# Patient Record
Sex: Male | Born: 1945 | Race: White | Hispanic: No | Marital: Married | Smoking: Former smoker
Health system: Southern US, Community
[De-identification: ages and names within clinical notes are randomized; demographics above are authoritative.]

## PROBLEM LIST (undated history)

## (undated) DIAGNOSIS — I4891 Unspecified atrial fibrillation: Secondary | ICD-10-CM

## (undated) DIAGNOSIS — N400 Enlarged prostate without lower urinary tract symptoms: Secondary | ICD-10-CM

## (undated) DIAGNOSIS — I10 Essential (primary) hypertension: Secondary | ICD-10-CM

## (undated) DIAGNOSIS — J449 Chronic obstructive pulmonary disease, unspecified: Secondary | ICD-10-CM

## (undated) HISTORY — PX: HERNIA REPAIR: SHX51

---

## 2003-06-12 ENCOUNTER — Ambulatory Visit (HOSPITAL_COMMUNITY): Admission: RE | Admit: 2003-06-12 | Discharge: 2003-06-12 | Payer: Self-pay | Admitting: Internal Medicine

## 2003-06-12 ENCOUNTER — Encounter: Payer: Self-pay | Admitting: Internal Medicine

## 2003-06-12 ENCOUNTER — Encounter (INDEPENDENT_AMBULATORY_CARE_PROVIDER_SITE_OTHER): Payer: Self-pay | Admitting: Internal Medicine

## 2003-09-20 ENCOUNTER — Emergency Department (HOSPITAL_COMMUNITY): Admission: EM | Admit: 2003-09-20 | Discharge: 2003-09-20 | Payer: Self-pay | Admitting: Emergency Medicine

## 2004-05-28 ENCOUNTER — Encounter (INDEPENDENT_AMBULATORY_CARE_PROVIDER_SITE_OTHER): Payer: Self-pay | Admitting: Internal Medicine

## 2005-03-11 ENCOUNTER — Encounter (INDEPENDENT_AMBULATORY_CARE_PROVIDER_SITE_OTHER): Payer: Self-pay | Admitting: Internal Medicine

## 2005-10-20 ENCOUNTER — Ambulatory Visit: Payer: Self-pay | Admitting: Internal Medicine

## 2005-10-31 ENCOUNTER — Telehealth (INDEPENDENT_AMBULATORY_CARE_PROVIDER_SITE_OTHER): Payer: Self-pay | Admitting: Internal Medicine

## 2007-09-12 ENCOUNTER — Ambulatory Visit: Payer: Self-pay | Admitting: Internal Medicine

## 2007-09-12 DIAGNOSIS — J441 Chronic obstructive pulmonary disease with (acute) exacerbation: Secondary | ICD-10-CM | POA: Insufficient documentation

## 2007-09-12 DIAGNOSIS — J449 Chronic obstructive pulmonary disease, unspecified: Secondary | ICD-10-CM | POA: Insufficient documentation

## 2007-09-26 ENCOUNTER — Ambulatory Visit: Payer: Self-pay | Admitting: Internal Medicine

## 2007-09-27 ENCOUNTER — Telehealth (INDEPENDENT_AMBULATORY_CARE_PROVIDER_SITE_OTHER): Payer: Self-pay | Admitting: *Deleted

## 2007-09-27 LAB — CONVERTED CEMR LAB
BUN: 18 mg/dL (ref 6–23)
CO2: 26 meq/L (ref 19–32)
Calcium: 9.4 mg/dL (ref 8.4–10.5)
Chloride: 105 meq/L (ref 96–112)
Cholesterol: 173 mg/dL (ref 0–200)
Creatinine, Ser: 0.99 mg/dL (ref 0.40–1.50)
Glucose, Bld: 79 mg/dL (ref 70–99)
HDL: 34 mg/dL — ABNORMAL LOW (ref >39)
LDL Cholesterol: 115 mg/dL — ABNORMAL HIGH (ref 0–99)
Potassium: 4.7 meq/L (ref 3.5–5.3)
Sodium: 142 meq/L (ref 135–145)
Total CHOL/HDL Ratio: 5.1
Triglycerides: 118 mg/dL (ref <150)
VLDL: 24 mg/dL (ref 0–40)

## 2007-09-28 ENCOUNTER — Encounter (INDEPENDENT_AMBULATORY_CARE_PROVIDER_SITE_OTHER): Payer: Self-pay | Admitting: Internal Medicine

## 2007-11-26 ENCOUNTER — Ambulatory Visit: Payer: Self-pay | Admitting: Internal Medicine

## 2008-02-18 ENCOUNTER — Ambulatory Visit: Payer: Self-pay | Admitting: Internal Medicine

## 2008-02-18 DIAGNOSIS — R079 Chest pain, unspecified: Secondary | ICD-10-CM | POA: Insufficient documentation

## 2008-05-05 ENCOUNTER — Telehealth (INDEPENDENT_AMBULATORY_CARE_PROVIDER_SITE_OTHER): Payer: Self-pay | Admitting: Internal Medicine

## 2008-05-05 ENCOUNTER — Telehealth (INDEPENDENT_AMBULATORY_CARE_PROVIDER_SITE_OTHER): Payer: Self-pay | Admitting: *Deleted

## 2008-05-07 ENCOUNTER — Ambulatory Visit: Payer: Self-pay | Admitting: Internal Medicine

## 2008-05-28 ENCOUNTER — Ambulatory Visit: Payer: Self-pay | Admitting: Internal Medicine

## 2008-05-28 DIAGNOSIS — R030 Elevated blood-pressure reading, without diagnosis of hypertension: Secondary | ICD-10-CM

## 2008-10-15 ENCOUNTER — Ambulatory Visit: Payer: Self-pay | Admitting: Internal Medicine

## 2008-10-15 DIAGNOSIS — K5900 Constipation, unspecified: Secondary | ICD-10-CM | POA: Insufficient documentation

## 2010-10-12 NOTE — Letter (Signed)
Summary: Dr. Pearla Dubonnet Office Notes  Dr. Pearla Dubonnet Office Notes   Imported By: Lutricia Horsfall 09/12/2007 14:41:41  _____________________________________________________________________  External Attachment:    Type:   Image     Comment:   External Document

## 2011-10-05 ENCOUNTER — Emergency Department (HOSPITAL_COMMUNITY): Payer: Medicare Other

## 2011-10-05 ENCOUNTER — Emergency Department (HOSPITAL_COMMUNITY)
Admission: EM | Admit: 2011-10-05 | Discharge: 2011-10-05 | Disposition: A | Discharge status: Home / Self Care | Payer: Medicare Other | Attending: Emergency Medicine | Admitting: Emergency Medicine

## 2011-10-05 ENCOUNTER — Other Ambulatory Visit: Payer: Self-pay

## 2011-10-05 ENCOUNTER — Encounter (HOSPITAL_COMMUNITY): Payer: Self-pay | Admitting: Emergency Medicine

## 2011-10-05 DIAGNOSIS — Z87891 Personal history of nicotine dependence: Secondary | ICD-10-CM | POA: Insufficient documentation

## 2011-10-05 DIAGNOSIS — J438 Other emphysema: Secondary | ICD-10-CM | POA: Insufficient documentation

## 2011-10-05 DIAGNOSIS — J441 Chronic obstructive pulmonary disease with (acute) exacerbation: Secondary | ICD-10-CM

## 2011-10-05 DIAGNOSIS — I498 Other specified cardiac arrhythmias: Secondary | ICD-10-CM | POA: Insufficient documentation

## 2011-10-05 HISTORY — DX: Chronic obstructive pulmonary disease, unspecified: J44.9

## 2011-10-05 LAB — CBC
HCT: 42.6 % (ref 39.0–52.0)
Hemoglobin: 14.3 g/dL (ref 13.0–17.0)
MCH: 27.9 pg (ref 26.0–34.0)
MCHC: 33.6 g/dL (ref 30.0–36.0)
MCV: 83 fL (ref 78.0–100.0)
Platelets: 210 10*3/uL (ref 150–400)
RBC: 5.13 MIL/uL (ref 4.22–5.81)
RDW: 13.1 % (ref 11.5–15.5)
WBC: 8.4 10*3/uL (ref 4.0–10.5)

## 2011-10-05 LAB — DIFFERENTIAL
Basophils Absolute: 0 10*3/uL (ref 0.0–0.1)
Basophils Relative: 0 % (ref 0–1)
Eosinophils Absolute: 0 10*3/uL (ref 0.0–0.7)
Eosinophils Relative: 0 % (ref 0–5)
Lymphocytes Relative: 17 % (ref 12–46)
Lymphs Abs: 1.4 10*3/uL (ref 0.7–4.0)
Monocytes Absolute: 0.9 10*3/uL (ref 0.1–1.0)
Monocytes Relative: 10 % (ref 3–12)
Neutro Abs: 6.1 10*3/uL (ref 1.7–7.7)
Neutrophils Relative %: 72 % (ref 43–77)

## 2011-10-05 LAB — COMPREHENSIVE METABOLIC PANEL
ALT: 16 U/L (ref 0–53)
AST: 17 U/L (ref 0–37)
Albumin: 4 g/dL (ref 3.5–5.2)
Alkaline Phosphatase: 100 U/L (ref 39–117)
BUN: 18 mg/dL (ref 6–23)
CO2: 25 mEq/L (ref 19–32)
Calcium: 9.3 mg/dL (ref 8.4–10.5)
Chloride: 100 mEq/L (ref 96–112)
GFR calc Af Amer: >90 mL/min (ref >90)
Glucose, Bld: 105 mg/dL — ABNORMAL HIGH (ref 70–99)
Potassium: 3.9 mEq/L (ref 3.5–5.1)
Sodium: 136 mEq/L (ref 135–145)
Total Protein: 7.9 g/dL (ref 6.0–8.3)

## 2011-10-05 LAB — CARDIAC PANEL(CRET KIN+CKTOT+MB+TROPI)
Relative Index: 2.8 — ABNORMAL HIGH (ref 0.0–2.5)
Total CK: 114 U/L (ref 7–232)
Troponin I: <0.3 ng/mL (ref <0.30)

## 2011-10-05 MED ORDER — METHYLPREDNISOLONE SODIUM SUCC 125 MG IJ SOLR
125.0000 mg | Freq: Once | INTRAMUSCULAR | Status: AC
  Administered 2011-10-05: 125 mg via INTRAVENOUS
  Filled 2011-10-05: qty 2

## 2011-10-05 MED ORDER — AZITHROMYCIN 250 MG PO TABS
500.0000 mg | ORAL_TABLET | Freq: Once | ORAL | Status: AC
  Administered 2011-10-05: 500 mg via ORAL
  Filled 2011-10-05: qty 2

## 2011-10-05 MED ORDER — AZITHROMYCIN 250 MG PO TABS: 250.0000 mg | ORAL_TABLET | Freq: Every day | ORAL | Status: AC

## 2011-10-05 MED ORDER — PREDNISONE 10 MG PO TABS: 20.0000 mg | ORAL_TABLET | Freq: Two times a day (BID) | ORAL | Status: DC

## 2011-10-05 NOTE — ED Notes (Signed)
Pt coughed up thick white sputum while coughing.

## 2011-10-05 NOTE — ED Provider Notes (Signed)
History   This chart was scribed for Geoffery Lyons, MD by Clarita Crane. The patient was seen in room APA02/APA02 and the patient's care was started at 8:24PM.   CSN: [id number]  Arrival date & time 10/05/11  1944   First MD Initiated Contact with Patient 10/05/11 2022      Chief Complaint  Patient presents with  . Shortness of Breath    (Consider location/radiation/quality/duration/timing/severity/associated sxs/prior treatment) HPI Cole [name] is a 66 y.o. male who presents to the Emergency Department complaining of constant moderate to severe SOB onset 5 days ago and persistent since with associated chest congestion and productive cough. Notes use of home nebulizer provided mild to moderate relief. Denies chest pain, fever, swelling of lower extremities, abdominal pain. Patient with h/o emphysema. Denies cardiac history. Patient is a former smoker.  PCP-[place]  Past Medical History  Diagnosis Date  . COPD (chronic obstructive pulmonary disease)     Past Surgical History  Procedure Date  . Hernia repair     History reviewed. No pertinent family history.  History  Substance Use Topics  . Smoking status: Former Smoker    Quit date: 09/18/2002  . Smokeless tobacco: Not on file  . Alcohol Use: No      Review of Systems 10 Systems reviewed and are negative for acute change except as noted in the HPI.  Allergies  Review of patient's allergies indicates no known allergies.  Home Medications   Current Outpatient Rx  Name Route Sig Dispense Refill  . IPRATROPIUM-ALBUTEROL 18-103 MCG/ACT IN AERO Inhalation Inhale 2 puffs into the lungs 4 (four) times daily as needed. For shortness of breath    . IPRATROPIUM-ALBUTEROL 0.5-2.5 (3) MG/3ML IN SOLN Nebulization Take 3 mLs by nebulization every 4 (four) hours as needed. For shortness of breath      BP 157/97  Pulse 102  Temp(Src) 99.5 F (37.5 C) (Oral)  Resp 22  Ht 5\' 11"  (1.803 m)  Wt 194 lb (87.998 kg)  BMI 27.06  kg/m2  SpO2 90%  Physical Exam  Nursing note and vitals reviewed. Constitutional: He is oriented to person, place, and time. He appears well-developed and well-nourished. No distress.  HENT:  Head: Normocephalic and atraumatic.  Mouth/Throat: Oropharynx is clear and moist.  Eyes: EOM are normal. Pupils are equal, round, and reactive to light.  Neck: Neck supple. No tracheal deviation present.  Cardiovascular: Normal rate and regular rhythm.  Exam reveals no gallop and no friction rub.   No murmur heard. Pulmonary/Chest: Effort normal. He has no wheezes. He has no rales.       Expiratory Rhonchi bilaterally.   Abdominal: Soft. He exhibits no distension. There is no tenderness.  Musculoskeletal: Normal range of motion. He exhibits no edema.  Neurological: He is alert and oriented to person, place, and time. No sensory deficit.  Skin: Skin is warm and dry.  Psychiatric: He has a normal mood and affect. His behavior is normal.    ED Course  Procedures (including critical care time)  DIAGNOSTIC STUDIES: Oxygen Saturation is 90% on room air, low by my interpretation.    COORDINATION OF CARE: 8:26PM- Patient informed of current clinical impression and plan for treatment in ED. Patient agrees with plan set forth at this time.  9:59PM- Patient reports his SOB has improved at this time following administration of breathing treatments. Upon re-evaluation patient's breath sounds have improved. Patient informed of lab and imaging results. Patient informed of intent to d/c home. Patient agrees with  plan set forth at this time.  10:01PM- Patient ambulated with oxygen saturation monitor. Oxygen saturation levels remained constant during exertion. Will d/c patient home. Patient agrees with plan set forth.    Labs Reviewed  COMPREHENSIVE METABOLIC PANEL - Abnormal; Notable for the following:    Glucose, Bld 105 (*)    GFR calc non Af Amer 84 (*)    All other components within normal limits    CARDIAC PANEL(CRET KIN+CKTOT+MB+TROPI) - Abnormal; Notable for the following:    Relative Index 2.8 (*)    All other components within normal limits  CBC  DIFFERENTIAL   Dg Chest Portable 1 View  10/05/2011  *RADIOLOGY REPORT*  Clinical Data: Cough.  Shortness of breath.  PORTABLE CHEST - 1 VIEW  Comparison: Report from 09/20/2003  Findings: Airway thickening may reflect bronchitis or reactive airways disease.  No airspace opacity is identified to suggest bacterial pneumonia pattern.  The patient is rotated to the right on today's exam, resulting in reduced diagnostic sensitivity and specificity.  Cardiac and mediastinal contours appear unremarkable.  No pleural effusion is observed.  IMPRESSION:  1. Airway thickening may reflect bronchitis or reactive airways disease.  No airspace opacity is identified to suggest bacterial pneumonia pattern.  Original Report Authenticated By: Dellia Cloud, M.D.     No diagnosis found.   Date: 10/05/2011  Rate: 101  Rhythm: sinus tachycardia  QRS Axis: normal  Intervals: normal  ST/T Wave abnormalities: normal  Conduction Disutrbances:none  Narrative Interpretation:   Old EKG Reviewed: none available    MDM  Feels better with nebs, steroids.  He was walked around the ed with sats staying in the low 90's.  He looks good and will discharge him with antibiotics, steroids, regular nebs at home.      I personally performed the services described in this documentation, which was scribed in my presence. The recorded information has been reviewed and considered.     Geoffery Lyons, MD 10/05/11 [phone]

## 2011-10-05 NOTE — ED Notes (Signed)
Pt removed from O2 and ambulated in hallway by ED MD.

## 2011-10-05 NOTE — ED Notes (Signed)
Pt reports cough and shortness of breath that started on Saturday. Pt reports having a non-productive cough and increase shortness of breath that "got worse Today."

## 2011-10-05 NOTE — ED Notes (Signed)
Pt returned to O2 2L/[place] after obtaining RA sat.

## 2012-03-11 ENCOUNTER — Encounter (HOSPITAL_COMMUNITY): Payer: Self-pay | Admitting: *Deleted

## 2012-03-11 ENCOUNTER — Emergency Department (HOSPITAL_COMMUNITY)
Admission: EM | Admit: 2012-03-11 | Discharge: 2012-03-11 | Disposition: A | Discharge status: Home / Self Care | Payer: Medicare Other | Attending: Emergency Medicine | Admitting: Emergency Medicine

## 2012-03-11 DIAGNOSIS — J449 Chronic obstructive pulmonary disease, unspecified: Secondary | ICD-10-CM | POA: Insufficient documentation

## 2012-03-11 DIAGNOSIS — Z87891 Personal history of nicotine dependence: Secondary | ICD-10-CM | POA: Insufficient documentation

## 2012-03-11 DIAGNOSIS — L0390 Cellulitis, unspecified: Secondary | ICD-10-CM

## 2012-03-11 DIAGNOSIS — J4489 Other specified chronic obstructive pulmonary disease: Secondary | ICD-10-CM | POA: Insufficient documentation

## 2012-03-11 DIAGNOSIS — IMO0002 Reserved for concepts with insufficient information to code with codable children: Secondary | ICD-10-CM | POA: Insufficient documentation

## 2012-03-11 MED ORDER — TETANUS-DIPHTH-ACELL PERTUSSIS 5-2.5-18.5 LF-MCG/0.5 IM SUSP
0.5000 mL | Freq: Once | INTRAMUSCULAR | Status: AC
  Administered 2012-03-11: 0.5 mL via INTRAMUSCULAR
  Filled 2012-03-11: qty 0.5

## 2012-03-11 MED ORDER — AMOXICILLIN-POT CLAVULANATE 875-125 MG PO TABS: 1.0000 | ORAL_TABLET | Freq: Two times a day (BID) | ORAL | Status: AC

## 2012-03-11 MED ORDER — SODIUM CHLORIDE 0.9 % IV SOLN
3.0000 g | Freq: Once | INTRAVENOUS | Status: AC
  Administered 2012-03-11: 3 g via INTRAVENOUS
  Filled 2012-03-11: qty 3

## 2012-03-11 NOTE — Discharge Instructions (Signed)
Keep area/skin very clean. Take antibiotic (augmentin) as prescribed.  Take tylenol/advil as need.  Follow up with primary care doctor in the next 1-2 days for recheck. Return to ER right away if worse, spreading redness, increased swelling, severe pain, high fevers, weak/faint, other concern.      Cellulitis Cellulitis is an infection of the skin and the tissue beneath it. The area is typically red and tender. It is caused by germs (bacteria) (usually staph or strep) that enter the body through cuts or sores. Cellulitis most commonly occurs in the arms or lower legs.  HOME CARE INSTRUCTIONS   If you are given a prescription for medications which kill germs (antibiotics), take as directed until finished.   If the infection is on the arm or leg, keep the limb elevated as able.   Use a warm cloth several times per day to relieve pain and encourage healing.   See your caregiver for recheck of the infected site as directed if problems arise.   Only take over-the-counter or prescription medicines for pain, discomfort, or fever as directed by your caregiver.  SEEK MEDICAL CARE IF:   The area of redness (inflammation) is spreading, there are red streaks coming from the infected site, or if a part of the infection begins to turn dark in color.   The joint or bone underneath the infected skin becomes painful after the skin has healed.   The infection returns in the same or another area after it seems to have gone away.   A boil or bump swells up. This may be an abscess.   New, unexplained problems such as pain or fever develop.  SEEK IMMEDIATE MEDICAL CARE IF:   You have a fever.   You or your child feels drowsy or lethargic.   There is vomiting, diarrhea, or lasting discomfort or feeling ill (malaise) with muscle aches and pains.  MAKE SURE YOU:   Understand these instructions.   Will watch your condition.   Will get help right away if you are not doing well or get worse.    Document Released: 06/08/2005 Document Revised: 08/18/2011 Document Reviewed: 04/16/2008 [facility] Patient Information 2012 [place], [place].

## 2012-03-11 NOTE — ED Provider Notes (Signed)
History     CSN: [id number]  Arrival date & time 03/11/12  2044   First MD Initiated Contact with Patient 03/11/12 2105      Chief Complaint  Patient presents with  . Insect Bite  . Cellulitis    (Consider location/radiation/quality/duration/timing/severity/associated sxs/prior treatment) The history is provided by the patient.  pt c/o area soreness/redness to right forearm onset this morning. Says 2 weeks ago his dog may have bit his right arm, says was playing w dog and unsure if tooth or nails scratched him. Says dog acting normally, healthy, had its shots. pts tetanus unknown. No problems w area up until waking up this morning. Pt says unsure if insect bite or sting. Denies rapid progression of area of redness since onset. Is mildly sore. No severe arm pain. No erythematous streaking. No hx same. No hx cellulitis, no hx mrsa. Does not feel ill. No nv. No fever or chills. No weakness or faintness.   Past Medical History  Diagnosis Date  . COPD (chronic obstructive pulmonary disease)     Past Surgical History  Procedure Date  . Hernia repair     History reviewed. No pertinent family history.  History  Substance Use Topics  . Smoking status: Former Smoker    Quit date: 09/18/2002  . Smokeless tobacco: Not on file  . Alcohol Use: No      Review of Systems  Constitutional: Negative for fever and chills.  Respiratory: Negative for shortness of breath.   Cardiovascular: Negative for chest pain and leg swelling.  Gastrointestinal: Negative for nausea and vomiting.  Skin: Negative for rash.  Neurological: Negative for numbness.    Allergies  Review of patient's allergies indicates no known allergies.  Home Medications   Current Outpatient Rx  Name Route Sig Dispense Refill  . IPRATROPIUM-ALBUTEROL 18-103 MCG/ACT IN AERO Inhalation Inhale 2 puffs into the lungs 4 (four) times daily as needed. For shortness of breath    . IPRATROPIUM-ALBUTEROL 0.5-2.5 (3) MG/3ML IN  SOLN Nebulization Take 3 mLs by nebulization every 4 (four) hours as needed. For shortness of breath    . PREDNISONE 10 MG PO TABS Oral Take 2 tablets (20 mg total) by mouth 2 (two) times daily. 20 tablet 0    BP 167/80  Pulse 91  Temp 98.9 F (37.2 C)  Resp 20  Ht 5\' 9"  (1.753 m)  Wt 190 lb (86.183 kg)  BMI 28.06 kg/m2  SpO2 98%  Physical Exam  Nursing note and vitals reviewed. Constitutional: He is oriented to person, place, and time. He appears well-developed and well-nourished. No distress.  HENT:  Head: Atraumatic.  Eyes: Conjunctivae are normal.  Neck: Neck supple. No tracheal deviation present.  Cardiovascular: Normal rate.   Pulmonary/Chest: Effort normal. No accessory muscle usage. No respiratory distress.  Abdominal: He exhibits no distension.  Musculoskeletal: Normal range of motion.       Area of cellulitis to right forearm, mid forearm. Few healing scabs amidst area of cellulitis. No discrete bite mark seen. No fb. No area of fluctuance/abscess. No erythematous streaking. No l/a to RUE or axilla. Radial pulse 2+.   Neurological: He is alert and oriented to person, place, and time.  Skin: Skin is warm and dry.  Psychiatric: He has a normal mood and affect.    ED Course  Procedures (including critical care time)    MDM  Iv ns.  Exam c/w cellulitis.  Given hx dog bite to area (2 weeks ago), small scabs to  area from that prior bite, will rx unasyn then augmentin.  Tetanus im.    Outlined area of erythema/cellulitis. Discussed w pt pcp f/u 1-2 days, w return to ER right away if worse, spreading redness, severe pain, high fevers, nv, weak/faint.        Suzi Roots, MD 03/11/12 2127

## 2012-03-11 NOTE — ED Notes (Signed)
Pt states he thinks he was bit by something in his sleep last night. Pts right forearm is read and swollen.

## 2013-11-20 ENCOUNTER — Ambulatory Visit (HOSPITAL_COMMUNITY)
Admission: RE | Admit: 2013-11-20 | Discharge: 2013-11-20 | Disposition: A | Discharge status: Home / Self Care | Payer: Medicare PPO | Source: Ambulatory Visit | Attending: Family Medicine | Admitting: Family Medicine

## 2013-11-20 ENCOUNTER — Other Ambulatory Visit (HOSPITAL_COMMUNITY): Payer: Self-pay | Admitting: Family Medicine

## 2013-11-20 DIAGNOSIS — M1990 Unspecified osteoarthritis, unspecified site: Secondary | ICD-10-CM

## 2013-11-20 DIAGNOSIS — IMO0002 Reserved for concepts with insufficient information to code with codable children: Secondary | ICD-10-CM

## 2013-11-20 DIAGNOSIS — M51379 Other intervertebral disc degeneration, lumbosacral region without mention of lumbar back pain or lower extremity pain: Secondary | ICD-10-CM | POA: Insufficient documentation

## 2013-11-20 DIAGNOSIS — M47817 Spondylosis without myelopathy or radiculopathy, lumbosacral region: Secondary | ICD-10-CM | POA: Insufficient documentation

## 2013-11-20 DIAGNOSIS — M5137 Other intervertebral disc degeneration, lumbosacral region: Secondary | ICD-10-CM | POA: Insufficient documentation

## 2016-12-28 ENCOUNTER — Observation Stay (HOSPITAL_COMMUNITY)
Admission: EM | Admit: 2016-12-28 | Discharge: 2016-12-29 | Disposition: A | Discharge status: Home / Self Care | Payer: Medicare Other | Attending: Family Medicine | Admitting: Family Medicine

## 2016-12-28 ENCOUNTER — Emergency Department (HOSPITAL_COMMUNITY): Payer: Medicare Other

## 2016-12-28 ENCOUNTER — Encounter (HOSPITAL_COMMUNITY): Payer: Self-pay | Admitting: Physician Assistant

## 2016-12-28 DIAGNOSIS — R55 Syncope and collapse: Secondary | ICD-10-CM | POA: Insufficient documentation

## 2016-12-28 DIAGNOSIS — Z79899 Other long term (current) drug therapy: Secondary | ICD-10-CM | POA: Insufficient documentation

## 2016-12-28 DIAGNOSIS — Z8249 Family history of ischemic heart disease and other diseases of the circulatory system: Secondary | ICD-10-CM | POA: Diagnosis not present

## 2016-12-28 DIAGNOSIS — Z9119 Patient's noncompliance with other medical treatment and regimen: Secondary | ICD-10-CM | POA: Diagnosis not present

## 2016-12-28 DIAGNOSIS — J449 Chronic obstructive pulmonary disease, unspecified: Secondary | ICD-10-CM | POA: Diagnosis present

## 2016-12-28 DIAGNOSIS — J441 Chronic obstructive pulmonary disease with (acute) exacerbation: Secondary | ICD-10-CM | POA: Diagnosis not present

## 2016-12-28 DIAGNOSIS — I208 Other forms of angina pectoris: Secondary | ICD-10-CM

## 2016-12-28 DIAGNOSIS — R030 Elevated blood-pressure reading, without diagnosis of hypertension: Secondary | ICD-10-CM

## 2016-12-28 DIAGNOSIS — J410 Simple chronic bronchitis: Secondary | ICD-10-CM

## 2016-12-28 DIAGNOSIS — Z87891 Personal history of nicotine dependence: Secondary | ICD-10-CM | POA: Diagnosis not present

## 2016-12-28 DIAGNOSIS — R000 Tachycardia, unspecified: Secondary | ICD-10-CM | POA: Diagnosis not present

## 2016-12-28 DIAGNOSIS — I10 Essential (primary) hypertension: Secondary | ICD-10-CM | POA: Diagnosis not present

## 2016-12-28 DIAGNOSIS — I4891 Unspecified atrial fibrillation: Principal | ICD-10-CM | POA: Diagnosis present

## 2016-12-28 LAB — CBC WITH DIFFERENTIAL/PLATELET
BASOS ABS: 0.1 10*3/uL (ref 0.0–0.1)
Basophils Relative: 1 %
EOS ABS: 0.2 10*3/uL (ref 0.0–0.7)
Eosinophils Relative: 2 %
HCT: 45.4 % (ref 39.0–52.0)
Hemoglobin: 15.5 g/dL (ref 13.0–17.0)
Lymphocytes Relative: 17 %
Lymphs Abs: 1.9 10*3/uL (ref 0.7–4.0)
MCH: 28.5 pg (ref 26.0–34.0)
MCHC: 34.1 g/dL (ref 30.0–36.0)
MCV: 83.6 fL (ref 78.0–100.0)
Monocytes Absolute: 0.7 10*3/uL (ref 0.1–1.0)
Monocytes Relative: 7 %
Neutro Abs: 8.1 10*3/uL — ABNORMAL HIGH (ref 1.7–7.7)
Neutrophils Relative %: 73 %
Platelets: 227 10*3/uL (ref 150–400)
RBC: 5.43 MIL/uL (ref 4.22–5.81)
RDW: 13 % (ref 11.5–15.5)
WBC: 11 10*3/uL — ABNORMAL HIGH (ref 4.0–10.5)

## 2016-12-28 LAB — TROPONIN I
Troponin I: <0.03 ng/mL (ref <0.03)
Troponin I: <0.03 ng/mL (ref <0.03)
Troponin I: <0.03 ng/mL (ref <0.03)

## 2016-12-28 LAB — BASIC METABOLIC PANEL
Anion gap: 5 (ref 5–15)
BUN: 20 mg/dL (ref 6–20)
CO2: 29 mmol/L (ref 22–32)
Calcium: 8.9 mg/dL (ref 8.9–10.3)
Chloride: 102 mmol/L (ref 101–111)
Creatinine, Ser: 1.22 mg/dL (ref 0.61–1.24)
GFR calc Af Amer: >60 mL/min (ref >60)
GFR calc non Af Amer: 57 mL/min — ABNORMAL LOW (ref >60)
GLUCOSE: 121 mg/dL — AB (ref 65–99)
Potassium: 4.2 mmol/L (ref 3.5–5.1)
Sodium: 136 mmol/L (ref 135–145)

## 2016-12-28 LAB — MAGNESIUM: Magnesium: 2 mg/dL (ref 1.7–2.4)

## 2016-12-28 LAB — MRSA PCR SCREENING: MRSA by PCR: NEGATIVE

## 2016-12-28 LAB — BRAIN NATRIURETIC PEPTIDE: B Natriuretic Peptide: 56 pg/mL (ref 0.0–100.0)

## 2016-12-28 LAB — TSH: TSH: 1.413 u[IU]/mL (ref 0.350–4.500)

## 2016-12-28 MED ORDER — IPRATROPIUM BROMIDE 0.02 % IN SOLN: 0.5000 mg | Freq: Four times a day (QID) | RESPIRATORY_TRACT | Status: DC

## 2016-12-28 MED ORDER — DILTIAZEM HCL 60 MG PO TABS
60.0000 mg | ORAL_TABLET | Freq: Three times a day (TID) | ORAL | Status: DC
  Administered 2016-12-28 – 2016-12-29 (×3): 60 mg via ORAL
  Filled 2016-12-28 (×3): qty 1

## 2016-12-28 MED ORDER — DILTIAZEM LOAD VIA INFUSION
10.0000 mg | Freq: Once | INTRAVENOUS | Status: AC
  Administered 2016-12-28: 10 mg via INTRAVENOUS
  Filled 2016-12-28: qty 10

## 2016-12-28 MED ORDER — ACETAMINOPHEN 325 MG PO TABS: 650.0000 mg | ORAL_TABLET | ORAL | Status: DC | PRN

## 2016-12-28 MED ORDER — RIVAROXABAN 20 MG PO TABS
20.0000 mg | ORAL_TABLET | Freq: Every day | ORAL | Status: DC
  Administered 2016-12-28 – 2016-12-29 (×2): 20 mg via ORAL
  Filled 2016-12-28 (×2): qty 1

## 2016-12-28 MED ORDER — RIVAROXABAN (XARELTO) EDUCATION KIT FOR AFIB PATIENTS
PACK | Freq: Once | Status: AC
Start: 2016-12-28 — End: 2016-12-28
  Administered 2016-12-28: 10:00:00
  Filled 2016-12-28: qty 1

## 2016-12-28 MED ORDER — ONDANSETRON HCL 4 MG/2ML IJ SOLN: 4.0000 mg | Freq: Four times a day (QID) | INTRAMUSCULAR | Status: DC | PRN

## 2016-12-28 MED ORDER — ALBUTEROL SULFATE (2.5 MG/3ML) 0.083% IN NEBU: 2.5000 mg | INHALATION_SOLUTION | RESPIRATORY_TRACT | Status: DC | PRN

## 2016-12-28 MED ORDER — DILTIAZEM HCL-DEXTROSE 100-5 MG/100ML-% IV SOLN (PREMIX)
5.0000 mg/h | INTRAVENOUS | Status: DC
  Administered 2016-12-28: 10 mg/h via INTRAVENOUS
  Filled 2016-12-28 (×2): qty 100

## 2016-12-28 MED ORDER — IPRATROPIUM-ALBUTEROL 0.5-2.5 (3) MG/3ML IN SOLN
3.0000 mL | Freq: Three times a day (TID) | RESPIRATORY_TRACT | Status: DC
  Administered 2016-12-28 – 2016-12-29 (×3): 3 mL via RESPIRATORY_TRACT
  Filled 2016-12-28 (×3): qty 3

## 2016-12-28 MED ORDER — SODIUM CHLORIDE 0.9 % IV BOLUS (SEPSIS)
500.0000 mL | Freq: Once | INTRAVENOUS | Status: AC
  Administered 2016-12-28: 500 mL via INTRAVENOUS

## 2016-12-28 MED ORDER — RIVAROXABAN 20 MG PO TABS
20.0000 mg | ORAL_TABLET | Freq: Every day | ORAL | Status: DC
Start: 2016-12-28 — End: 2016-12-28

## 2016-12-28 MED ORDER — LEVALBUTEROL HCL 0.63 MG/3ML IN NEBU
0.6300 mg | INHALATION_SOLUTION | Freq: Four times a day (QID) | RESPIRATORY_TRACT | Status: DC
Start: 2016-12-28 — End: 2016-12-28

## 2016-12-28 MED ORDER — IPRATROPIUM-ALBUTEROL 0.5-2.5 (3) MG/3ML IN SOLN
3.0000 mL | Freq: Four times a day (QID) | RESPIRATORY_TRACT | Status: DC
  Administered 2016-12-28: 3 mL via RESPIRATORY_TRACT
  Filled 2016-12-28: qty 3

## 2016-12-28 NOTE — H&P (Signed)
History and Physical    Cole [name] RUE:[id number] DOB: [birth year] DOA: 12/28/2016  PCP: Isabella Stalling, MD  Patient coming from: home  I have personally briefly reviewed patient's old medical records in [facility]  Chief Complaint: lightheadedness  HPI: Cole [name] is a 71 y.o. male with medical history significant of COPD who presents to the hospital lightheadedness. Patient reports onset of symptoms this morning after he got up to get ready for work. Denies any chest pain, palpitations. He did have some shortness of breath, diaphoresis and lightheadedness. He had 2 loose stools this morning, none since then. He did not have any loss of consciousness. Feels that breathing is at baseline. No vomiting, dysuria, fever. EMS was called and he was noted to have a heart rate in the 150s in rapid atrial fibrillation. He received 1 dose of amiodarone without any significant change.  ED Course: Patient was noted to be tachycardic. He was started on a Cardizem infusion. Heart rate has since improved. Workup has otherwise been unremarkable. His been referred for admission.  Review of Systems: As per HPI otherwise 10 point review of systems negative.    Past Medical History:  Diagnosis Date  . COPD (chronic obstructive pulmonary disease) (HCC)     Past Surgical History:  Procedure Laterality Date  . HERNIA REPAIR       reports that he quit smoking about 14 years ago. He does not have any smokeless tobacco history on file. He reports that he does not drink alcohol or use drugs.  No Known Allergies  Family History  Problem Relation Age of Onset  . Heart attack Mother 20  . Atrial fibrillation Brother     Prior to Admission medications   Medication Sig Start Date End Date Taking? Authorizing Provider  albuterol-ipratropium (COMBIVENT) 18-103 MCG/ACT inhaler Inhale 2 puffs into the lungs 4 (four) times daily as needed. For shortness of breath   Yes Historical Provider, MD    ipratropium-albuterol (DUONEB) 0.5-2.5 (3) MG/3ML SOLN Take 3 mLs by nebulization every 4 (four) hours as needed. For shortness of breath   Yes Historical Provider, MD    Physical Exam: Vitals:   12/28/16 1021 12/28/16 1026 12/28/16 1042 12/28/16 1047  BP:    116/75  Pulse: 90   93  Resp: 18   (!) 26  Temp:  97.6 F (36.4 C) 97.8 F (36.6 C)   TempSrc:  Oral Oral   SpO2: 96%   95%  Weight:   87.2 kg (192 lb 3.9 oz)   Height:    (1.803 m)     Constitutional: NAD, calm, comfortable Vitals:   12/28/16 1021 12/28/16 1026 12/28/16 1042 12/28/16 1047  BP:    116/75  Pulse: 90   93  Resp: 18   (!) 26  Temp:  97.6 F (36.4 C) 97.8 F (36.6 C)   TempSrc:  Oral Oral   SpO2: 96%   95%  Weight:   87.2 kg (192 lb 3.9 oz)   Height:    (1.803 m)    Eyes: PERRL, lids and conjunctivae normal ENMT: Mucous membranes are moist. Posterior pharynx clear of any exudate or lesions.Normal dentition.  Neck: normal, supple, no masses, no thyromegaly Respiratory: clear to auscultation bilaterally, no wheezing, no crackles. Normal respiratory effort. No accessory muscle use.  Cardiovascular: irregular, no murmurs / rubs / gallops. No extremity edema. 2+ pedal pulses. No carotid bruits.  Abdomen: no tenderness, no masses palpated. No hepatosplenomegaly. Bowel sounds positive.  Musculoskeletal: no clubbing / cyanosis. No joint deformity upper and lower extremities. Good ROM, no contractures. Normal muscle tone.  Skin: no rashes, lesions, ulcers. No induration Neurologic: CN 2-12 grossly intact. Sensation intact, DTR normal. Strength 5/5 in all 4.  Psychiatric: Normal judgment and insight. Alert and oriented x 3. Normal mood.   Labs on Admission: I have personally reviewed following labs and imaging studies  CBC:  Recent Labs Lab 12/28/16 0724  WBC 11.0*  NEUTROABS 8.1*  HGB 15.5  HCT 45.4  MCV 83.6  PLT 227   Basic Metabolic Panel:  Recent Labs Lab 12/28/16 0724  12/28/16 0729  NA 136  --   K 4.2  --   CL 102  --   CO2 29  --   GLUCOSE 121*  --   BUN 20  --   CREATININE 1.22  --   CALCIUM 8.9  --   MG  --  2.0   GFR: Estimated Creatinine Clearance: 57.4 mL/min (by C-G formula based on SCr of 1.22 mg/dL). Liver Function Tests: No results for input(s): AST, ALT, ALKPHOS, BILITOT, PROT, ALBUMIN in the last 168 hours. No results for input(s): LIPASE, AMYLASE in the last 168 hours. No results for input(s): AMMONIA in the last 168 hours. Coagulation Profile: No results for input(s): INR, PROTIME in the last 168 hours. Cardiac Enzymes:  Recent Labs Lab 12/28/16 0724  TROPONINI <0.03   BNP (last 3 results) No results for input(s): PROBNP in the last 8760 hours. HbA1C: No results for input(s): HGBA1C in the last 72 hours. CBG: No results for input(s): GLUCAP in the last 168 hours. Lipid Profile: No results for input(s): CHOL, HDL, LDLCALC, TRIG, CHOLHDL, LDLDIRECT in the last 72 hours. Thyroid Function Tests: No results for input(s): TSH, T4TOTAL, FREET4, T3FREE, THYROIDAB in the last 72 hours. Anemia Panel: No results for input(s): VITAMINB12, FOLATE, FERRITIN, TIBC, IRON, RETICCTPCT in the last 72 hours. Urine analysis: No results found for: COLORURINE, APPEARANCEUR, LABSPEC, PHURINE, GLUCOSEU, HGBUR, BILIRUBINUR, KETONESUR, PROTEINUR, UROBILINOGEN, NITRITE, LEUKOCYTESUR  Radiological Exams on Admission: Dg Chest 2 View  Result Date: 12/28/2016 CLINICAL DATA:  Tachycardia EXAM: CHEST  2 VIEW COMPARISON:  10/05/2011 FINDINGS: Cardiac shadow is within normal limits. The lungs are well aerated bilaterally. No focal infiltrate is seen. Very mild interstitial changes are noted. No bony abnormality is seen. IMPRESSION: No active cardiopulmonary disease. Electronically Signed   By: Alcide Clever M.D.   On: 12/28/2016 08:17    EKG: Independently reviewed. rapid atrial fib  Assessment/Plan Active Problems:   COPD (chronic obstructive  pulmonary disease) (HCC)   Atrial fibrillation with RVR (HCC)   1. Atrial fibrillation with rapid ventricular response. Currently on Cardizem infusion. Cardiology has been consulted. CHADSVASc score of 2. Started on anticoagulation. Echocardiogram and thyroid studies have been ordered. We'll cycle troponins. Transition to oral meds pending recommendations from cardiology  2. COPD . appears to be at baseline. No wheezing. Continue bronchodilators.  DVT prophylaxis: Xarelto Code Status: full code Family Communication: discussed with wife at the bedside Disposition Plan: discharge home once improved Consults called: cardiology Admission status: observation/stepdown   Zyeir Dymek MD [facility] Pager [phone]  If 7PM-7AM, please contact night-coverage [email or web address] Password [facility]  12/28/2016, 11:31 AM

## 2016-12-28 NOTE — ED Triage Notes (Signed)
Pt woke up diaphoretic and felt like he was going to pass out about 430 am today. Pt was getting ready for work. Pt arrived to ed nondiaphoretic and no complaints. A/o. States episode lasted 1.5 hrs

## 2016-12-28 NOTE — ED Provider Notes (Signed)
[facility]Y DEPT Provider Note   CSN: [id number] Arrival date & time: 12/28/16  0701     History   Chief Complaint Chief Complaint  Patient presents with  . Dizziness    HPI Cole [name] is a 71 y.o. male.  HPI  Pt was seen at 0710. Per EMS and pt report, c/o sudden onset and resolution of one episode of near syncope that occurred this morning approximately 0430 PTA. Pt states he was getting ready for work when he felt SOB, "sweaty," "had diarrhea," and "felt like I was going to pass out." EMS states HR was "150's" on scene, IV amiodarone given without change. Pt denies hx of similar symptoms, no cough, no CP/palpitations, no back pain, no N/V, no abd pain, no focal motor weakness.    Past Medical History:  Diagnosis Date  . COPD (chronic obstructive pulmonary disease)     Patient Active Problem List   Diagnosis Date Noted  . CONSTIPATION 10/15/2008  . ELEVATED BLOOD PRESSURE WITHOUT DIAGNOSIS OF HYPERTENSION 05/28/2008  . CHEST PAIN 02/18/2008  . CHRONIC OBSTRUCTIVE PULMONARY DISEASE, ACUTE EXACERBATION 09/12/2007  . COPD 09/12/2007    Past Surgical History:  Procedure Laterality Date  . HERNIA REPAIR         Home Medications    Prior to Admission medications   Medication Sig Start Date End Date Taking? Authorizing Provider  albuterol-ipratropium (COMBIVENT) 18-103 MCG/ACT inhaler Inhale 2 puffs into the lungs 4 (four) times daily as needed. For shortness of breath    Historical Provider, MD  ipratropium-albuterol (DUONEB) 0.5-2.5 (3) MG/3ML SOLN Take 3 mLs by nebulization every 4 (four) hours as needed. For shortness of breath    Historical Provider, MD    Family History No family history on file.  Social History Social History  Substance Use Topics  . Smoking status: Former Smoker    Quit date: 09/18/2002  . Smokeless tobacco: Not on file  . Alcohol use No     Allergies   Patient has no known allergies.   Review of Systems Review of Systems ROS:  Statement: All systems negative except as marked or noted in the HPI; Constitutional: Negative for fever and chills. ; ; Eyes: Negative for eye pain, redness and discharge. ; ; ENMT: Negative for ear pain, hoarseness, nasal congestion, sinus pressure and sore throat. ; ; Cardiovascular: Negative for chest pain, palpitations,, and peripheral edema. ; ; Respiratory: +SOB. Negative for cough, wheezing and stridor. ; ; Gastrointestinal: +diarrhea. Negative for nausea, vomiting, abdominal pain, blood in stool, hematemesis, jaundice and rectal bleeding. . ; ; Genitourinary: Negative for dysuria, flank pain and hematuria. ; ; Musculoskeletal: Negative for back pain and neck pain. Negative for swelling and trauma.; ; Skin: +diaphoresis. Negative for pruritus, rash, abrasions, blisters, bruising and skin lesion.; ; Neuro: +lightheadedness. Negative for headache and neck stiffness. Negative for weakness, altered level of consciousness, altered mental status, extremity weakness, paresthesias, involuntary movement, seizure and syncope.       Physical Exam Updated Vital Signs BP 120/80 (BP Location: Left Arm)   Pulse (!) 27   Temp 97.7 F (36.5 C) (Oral)   Resp (!) 23   Ht  (1.803 m)   Wt 200 lb (90.7 kg)   SpO2 96%   BMI 27.89 kg/m    Patient Vitals for the past 24 hrs:  BP Temp Temp src Pulse Resp SpO2 Height Weight  12/28/16 0910 95/73 - - (!) 105 (!) 25 96 % - -  12/28/16 1478  92/73 - - (!) 113 (!) 30 94 % - -  12/28/16 0856 - - - (!) 106 19 95 % - -  12/28/16 0844 - - - - (!) 23 95 % - -  12/28/16 0836 - - - - - 96 % - -  12/28/16 0832 - - - - (!) 25 95 % - -  12/28/16 0830 - - - 95 (!) 28 96 % - -  12/28/16 0815 - - - - - 96 % - -  12/28/16 0814 - - - - (!) 23 98 % - -  12/28/16 0813 - - - - (!) 22 97 % - -  12/28/16 0812 - - - - - 96 % - -  12/28/16 0811 - - - - (!) 21 97 % - -  12/28/16 0810 - - - - 18 95 % - -  12/28/16 0809 - - - - - 96 % - -  12/28/16 0808 - - - - (!) 22 96 % -  -  12/28/16 0807 - - - - (!) 24 99 % - -  12/28/16 0806 - - - - - 97 % - -  12/28/16 0804 - - - - - 96 % - -  12/28/16 0800 - - - - 15 - - -  12/28/16 0754 - - - (!) 126 19 95 % - -  12/28/16 0749 - - - (!) 27 (!) 23 96 % - -  12/28/16 0748 - - - 65 20 95 % - -  12/28/16 0747 - - - (!) 104 (!) 26 96 % - -  12/28/16 0746 - - - (!) 109 (!) 22 96 % - -  12/28/16 0745 - - - 66 (!) 25 95 % - -  12/28/16 0744 - - - 100 (!) 30 96 % - -  12/28/16 0743 - - - (!) 54 (!) 27 97 % - -  12/28/16 0738 - - - 66 (!) 25 95 % - -  12/28/16 0729 - 97.7 F (36.5 C) Oral - - - - -  12/28/16 0708 (!) 111/93 - Oral (!) 53 (!) 21 96 % - -  12/28/16 0705 126/79 - - - - - - -  12/28/16 0703 - - - - - -  (1.803 m) 200 lb (90.7 kg)     Physical Exam 0715: Physical examination:  Nursing notes reviewed; Vital signs and O2 SAT reviewed;  Constitutional: Well developed, Well nourished, Well hydrated, In no acute distress; Head:  Normocephalic, atraumatic; Eyes: EOMI, PERRL, No scleral icterus; ENMT: Mouth and pharynx normal, Mucous membranes moist; Neck: Supple, Full range of motion, No lymphadenopathy; Cardiovascular: Irregular rate and tachycardic rhythm, No gallop; Respiratory: Breath sounds clear & equal bilaterally, No wheezes.  Speaking full sentences with ease, Normal respiratory effort/excursion; Chest: Nontender, Movement normal; Abdomen: Soft, Nontender, Nondistended, Normal bowel sounds; Genitourinary: No CVA tenderness; Extremities: Pulses normal, No tenderness, +tr pedal edema bilat. No calf edema or asymmetry.; Neuro: AA&Ox3, Major CN grossly intact.  Speech clear. No gross focal motor or sensory deficits in extremities.; Skin: Color normal, Warm, Dry.   ED Treatments / Results  Labs (all labs ordered are listed, but only abnormal results are displayed)   EKG  EKG Interpretation  Date/Time:  Wednesday December 28 2016 07:06:36 EDT Ventricular Rate:  143 PR Interval:    QRS Duration: 98 QT  Interval:  314 QTC Calculation: 486 R Axis:   -49 Text Interpretation:  Atrial fibrillation  LAD, consider left anterior fascicular block Probable anteroseptal infarct, old Baseline wander When compared with ECG of 10/05/2011 Atrial fibrillation has replaced Sinus tachycardia Confirmed by [facility]  MD, Nicholos Johns [phone]) on 12/28/2016 7:26:33 AM       Radiology   Procedures Procedures (including critical care time)  Medications Ordered in ED Medications  diltiazem (CARDIZEM) 1 mg/mL load via infusion 10 mg (10 mg Intravenous Bolus from Bag 12/28/16 0742)    And  diltiazem (CARDIZEM) 100 mg in dextrose 5% (1 mg/mL) infusion (10 mg/hr Intravenous Rate/Dose Change 12/28/16 0742)     Initial Impression / Assessment and Plan / ED Course  I have reviewed the triage vital signs and the nursing notes.  Pertinent labs & imaging results that were available during my care of the patient were reviewed by me and considered in my medical decision making (see chart for details).  MDM Reviewed: previous chart, nursing note and vitals Reviewed previous: labs and ECG Interpretation: labs, ECG and x-ray Total time providing critical care: 30-74 minutes. This excludes time spent performing separately reportable procedures and services. Consults: cardiology and admitting MD   CRITICAL CARE Performed by: Laray Anger Total critical care time: 35 minutes Critical care time was exclusive of separately billable procedures and treating other patients. Critical care was necessary to treat or prevent imminent or life-threatening deterioration. Critical care was time spent personally by me on the following activities: development of treatment plan with patient and/or surrogate as well as nursing, discussions with consultants, evaluation of patient's response to treatment, examination of patient, obtaining history from patient or surrogate, ordering and performing treatments and interventions, ordering  and review of laboratory studies, ordering and review of radiographic studies, pulse oximetry and re-evaluation of patient's condition.   Results for orders placed or performed during the hospital encounter of 12/28/16  CBC with Differential  Result Value Ref Range   WBC 11.0 (H) 4.0 - 10.5 K/uL   RBC 5.43 4.22 - 5.81 MIL/uL   Hemoglobin 15.5 13.0 - 17.0 g/dL   HCT 62.1 30.8 - 65.7 %   MCV 83.6 78.0 - 100.0 fL   MCH 28.5 26.0 - 34.0 pg   MCHC 34.1 30.0 - 36.0 g/dL   RDW 84.6 96.2 - 95.2 %   Platelets 227 150 - 400 K/uL   Neutrophils Relative % 73 %   Neutro Abs 8.1 (H) 1.7 - 7.7 K/uL   Lymphocytes Relative 17 %   Lymphs Abs 1.9 0.7 - 4.0 K/uL   Monocytes Relative 7 %   Monocytes Absolute 0.7 0.1 - 1.0 K/uL   Eosinophils Relative 2 %   Eosinophils Absolute 0.2 0.0 - 0.7 K/uL   Basophils Relative 1 %   Basophils Absolute 0.1 0.0 - 0.1 K/uL  Basic metabolic panel  Result Value Ref Range   Sodium 136 135 - 145 mmol/L   Potassium 4.2 3.5 - 5.1 mmol/L   Chloride 102 101 - 111 mmol/L   CO2 29 22 - 32 mmol/L   Glucose, Bld 121 (H) 65 - 99 mg/dL   BUN 20 6 - 20 mg/dL   Creatinine, Ser 8.41 0.61 - 1.24 mg/dL   Calcium 8.9 8.9 - 32.4 mg/dL   GFR calc non Af Amer 57 (L) >60 mL/min   GFR calc Af Amer >60 >60 mL/min   Anion gap 5 5 - 15  Troponin I  Result Value Ref Range   Troponin I <0.03 <0.03 ng/mL  Brain natriuretic peptide  Result Value Ref  Range   B Natriuretic Peptide 56.0 0.0 - 100.0 pg/mL  Magnesium  Result Value Ref Range   Magnesium 2.0 1.7 - 2.4 mg/dL   Dg Chest 2 View Result Date: 12/28/2016 CLINICAL DATA:  Tachycardia EXAM: CHEST  2 VIEW COMPARISON:  10/05/2011 FINDINGS: Cardiac shadow is within normal limits. The lungs are well aerated bilaterally. No focal infiltrate is seen. Very mild interstitial changes are noted. No bony abnormality is seen. IMPRESSION: No active cardiopulmonary disease. Electronically Signed   By: Alcide Clever M.D.   On: 12/28/2016 08:17     0920:  On arrival: monitor afib/RVR, rate 140-150's. Pt denies CP or palpitations; unknown onset of afib.  IV cardizem bolus given and gtt started. Gtt titrated up with good effect; HR now 90-100's. BP soft; judicious IVF bolus given. Family requesting Dr. Wyline Mood to consult. Dx and testing d/w pt and family.  Questions answered.  Verb understanding, agreeable to admit. T/C to [facility] Dr. Wyline Mood, case discussed, including:  HPI, pertinent PM/SHx, VS/PE, dx testing, ED course and treatment:  Agreeable to consult. T/C to [facility] Dr. Kerry Hough, case discussed, including:  HPI, pertinent PM/SHx, VS/PE, dx testing, ED course and treatment:  Agreeable to admit.     Final Clinical Impressions(s) / ED Diagnoses   Final diagnoses:  None    New Prescriptions New Prescriptions   No medications on file     Samuel Jester, DO 01/01/17 1635

## 2016-12-28 NOTE — Plan of Care (Signed)
Problem: Pain Managment: Goal: General experience of comfort will improve Outcome: Progressing Patient has been free of pain. Will continue to monitor.  Problem: Activity: Goal: Risk for activity intolerance will decrease Outcome: Progressing Patient is taking rest periods between activities. PRN and scheduled breathing treatments are ordered.

## 2016-12-28 NOTE — Progress Notes (Signed)
ANTICOAGULATION CONSULT NOTE - Initial Consult  Pharmacy Consult for Xarelto Indication: atrial fibrillation  No Known Allergies  Patient Measurements: Height:  (180.3 cm) Weight: 192 lb 3.9 oz (87.2 kg) IBW/kg (Calculated) : 75.3  Vital Signs: Temp: 97.8 F (36.6 C) (04/18 1042) Temp Source: Oral (04/18 1042) BP: 116/75 (04/18 1047) Pulse Rate: 93 (04/18 1047)  Labs:  Recent Labs  12/28/16 0724  HGB 15.5  HCT 45.4  PLT 227  CREATININE 1.22  TROPONINI <0.03    Estimated Creatinine Clearance: 57.4 mL/min (by C-G formula based on SCr of 1.22 mg/dL).   Medical History: Past Medical History:  Diagnosis Date  . COPD (chronic obstructive pulmonary disease) (HCC)     Medications:  Prescriptions Prior to Admission  Medication Sig Dispense Refill Last Dose  . albuterol-ipratropium (COMBIVENT) 18-103 MCG/ACT inhaler Inhale 2 puffs into the lungs 4 (four) times daily as needed. For shortness of breath   12/27/2016 at Unknown time  . ipratropium-albuterol (DUONEB) 0.5-2.5 (3) MG/3ML SOLN Take 3 mLs by nebulization every 4 (four) hours as needed. For shortness of breath   12/27/2016 at Unknown time    Assessment: 71 yo male that presented to ED because he flet like he was going to pass out this morning. New onset afib. Pharmacy asked to start Xarelto.  Goal of Therapy:   Monitor platelets by anticoagulation protocol: Yes   Plan:  Xarelto  daily with supper Educate patient  Monitor for S/S of bleeding  Elder Cyphers, BS Loura Back, BCPS Clinical Pharmacist Pager [phone] 12/28/2016,10:52 AM

## 2016-12-28 NOTE — ED Triage Notes (Signed)
Pt cbg 158.   Hr 150s in route was given  amiodarone with no changes per ems

## 2016-12-28 NOTE — Consult Note (Addendum)
Cardiology Consultation   Patient ID: Rawlin Reaume; [id number]; August 20, 1946   Admit date: 12/28/2016 Date of Consult: 12/28/2016  Referring MD:  Dr. Clarene Duke Cardiologist:new Consulting Cardiologist: Dr. Wyline Mood  Patient Care Team: Oval Linsey, MD as PCP - General (Internal Medicine)    Reason for Consultation: Atrial fib with RVR   History of Present Illness: Ashutosh Dieguez is a 71 y.o. male with a hx of COPD & HTN(stopped med 6 months ago) who developed diaphoresis and presyncope and diarrhea while getting ready for work. EMS found heart rate to be 150/m and didn't respond to Amiodarone. Now in Afib with HR controlled on IV diltiazem. Had a similar episode many years ago but never was checked. Denies any chest pain, palpitations, dyspnea. Doesn't exercise. Mother MI 37's, Brother with Afib. He smoked over 60 pk yrs(4 pks/d when he quit 10 yrs ago), No DM, HLD. CHADSVASC=2 age, HTN  Labs normal except for WBC 11. Troponin negative.  Past Medical History:  Diagnosis Date  . COPD (chronic obstructive pulmonary disease) (HCC)     Past Surgical History:  Procedure Laterality Date  . HERNIA REPAIR        Home Meds: Prior to Admission medications   Medication Sig Start Date End Date Taking? Authorizing Provider  albuterol-ipratropium (COMBIVENT) 18-103 MCG/ACT inhaler Inhale 2 puffs into the lungs 4 (four) times daily as needed. For shortness of breath   Yes Historical Provider, MD  ipratropium-albuterol (DUONEB) 0.5-2.5 (3) MG/3ML SOLN Take 3 mLs by nebulization every 4 (four) hours as needed. For shortness of breath   Yes Historical Provider, MD    Current Medications:    Allergies:   No Known Allergies  Social History:   The patient  reports that he quit smoking about 14 years ago. He does not have any smokeless tobacco history on file. He reports that he does not drink alcohol or use drugs.    Family History:   The patient's family history includes Atrial fibrillation in his  brother; Heart attack (age of onset: 13) in his mother.   ROS:  Please see the history of present illness.  Review of Systems  Constitution: Negative.  HENT: Negative.   Cardiovascular: Positive for dyspnea on exertion.  Respiratory: Positive for cough and wheezing.   Endocrine: Negative.   Hematologic/Lymphatic: Negative.   Musculoskeletal: Negative.   Gastrointestinal: Negative.   Genitourinary: Negative.   Neurological: Negative.    All other ROS reviewed and negative.      Vital Signs: Blood pressure 116/76, pulse 100, temperature 97.7 F (36.5 C), temperature source Oral, resp. rate (!) 24, height  (1.803 m), weight 200 lb (90.7 kg), SpO2 96 %.   PHYSICAL EXAM: General:  Obese, in no acute distress  HEENT: normal Lymph: no adenopathy Neck: no JVD Endocrine:  No thryomegaly Vascular: No carotid bruits; FA pulses 2+ bilaterally without bruits  Cardiac:  Irreg irreg;distant HS, normal S1, S2; no murmur, rub, bruit, thrill, or heave Lungs: Decreased breath sounds with wheezing lower lung fields.  Abd: soft, nontender, no hepatomegaly  Ext: no edema, Good distal pulses bilaterally Musculoskeletal:  No deformities, BUE and BLE strength normal and equal Skin: warm and dry  Neuro:  CNs 2-12 intact, no focal abnormalities noted Psych:  Normal affect    EKG:  Atrial fibrillation with RVR and poor R wave progression ant- personally reviewed  Telemetry: Afib at 100-150/m -personally reviewed  Labs:  Recent Labs  12/28/16 0724  TROPONINI <0.03   Lab Results  Component Value Date   WBC 11.0 (H) 12/28/2016   HGB 15.5 12/28/2016   HCT 45.4 12/28/2016   MCV 83.6 12/28/2016   PLT 227 12/28/2016    Recent Labs Lab 12/28/16 0724  NA 136  K 4.2  CL 102  CO2 29  BUN 20  CREATININE 1.22  CALCIUM 8.9  GLUCOSE 121*   Lab Results  Component Value Date   CHOL 173 09/26/2007   HDL 34 (L) 09/26/2007   LDLCALC 115 (H) 09/26/2007   TRIG 118 09/26/2007   No  results found for: DDIMER  Radiology/Studies:  Dg Chest 2 View  Result Date: 12/28/2016 CLINICAL DATA:  Tachycardia EXAM: CHEST  2 VIEW COMPARISON:  10/05/2011 FINDINGS: Cardiac shadow is within normal limits. The lungs are well aerated bilaterally. No focal infiltrate is seen. Very mild interstitial changes are noted. No bony abnormality is seen. IMPRESSION: No active cardiopulmonary disease. Electronically Signed   By: Alcide Clever M.D.   On: 12/28/2016 08:17     PROBLEM LIST:  Active Problems:   Atrial fibrillation with RVR (HCC)     ASSESSMENT AND PLAN:  Atrial fibrillation with RVR now controlled on IV Diltiazem. Transition to oral later today. CHADSVASC=2 age, HTN so needs NOAC.Probably more likely to take a once daily drug. Will start Xarelto.Check 2Decho.  HTN: stopped med 6 months ago because he says his BP was normal  COPD- quit 10 yrs ago but over 60-70 pk yrs  Family history of CAD and Afib   Signed, Jacolyn Reedy, PA-C  12/28/2016 9:57 AM   Attending note Patient seen and discussed with PA Geni Bers, I agree with her documentation above. 71 yo male history of COPD but no known prior cardiac history presents with dizziness and SOB. Found to be in afib with RVR on admission, a new diagnosis for the patient. He does not feel specific palpitations so the duration of the rhythm is unclear.  Started on dilt gtt. From notes was given  of IV amio in the ambulance.   WBC 11, Hgb 15.5, Plt 227, K 4.2, Cr 1.22, BNP 56, Mg 2, TSH pending Trop neg x 1 EKG afib with RVR   Patient admitted with SOB and dizziness, found to be in afib with RVR. New diagnosis for him. Started on dilt drip. CHADS2Vasc score is 2, started on xarelto. Follow rates and bp's, likely try to convert to oral later today. F/u TSH, echo. We will start oral dilt  every 8 hrs with hold parameters, wean dilt drip.    Dina Rich MD

## 2016-12-29 ENCOUNTER — Observation Stay (HOSPITAL_COMMUNITY): Payer: Medicare Other

## 2016-12-29 ENCOUNTER — Other Ambulatory Visit (HOSPITAL_COMMUNITY): Payer: Medicare Other

## 2016-12-29 ENCOUNTER — Other Ambulatory Visit: Payer: Self-pay | Admitting: *Deleted

## 2016-12-29 DIAGNOSIS — I4891 Unspecified atrial fibrillation: Secondary | ICD-10-CM | POA: Diagnosis not present

## 2016-12-29 LAB — TROPONIN I: Troponin I: <0.03 ng/mL (ref <0.03)

## 2016-12-29 LAB — CBC
HEMATOCRIT: 43.2 % (ref 39.0–52.0)
HEMOGLOBIN: 13.9 g/dL (ref 13.0–17.0)
MCH: 27.4 pg (ref 26.0–34.0)
MCHC: 32.2 g/dL (ref 30.0–36.0)
MCV: 85 fL (ref 78.0–100.0)
Platelets: 228 10*3/uL (ref 150–400)
RBC: 5.08 MIL/uL (ref 4.22–5.81)
RDW: 13.3 % (ref 11.5–15.5)
WBC: 9.1 10*3/uL (ref 4.0–10.5)

## 2016-12-29 LAB — BASIC METABOLIC PANEL
ANION GAP: 7 (ref 5–15)
BUN: 26 mg/dL — ABNORMAL HIGH (ref 6–20)
CO2: 28 mmol/L (ref 22–32)
Calcium: 8.8 mg/dL — ABNORMAL LOW (ref 8.9–10.3)
Chloride: 104 mmol/L (ref 101–111)
Creatinine, Ser: 1.17 mg/dL (ref 0.61–1.24)
GLUCOSE: 115 mg/dL — AB (ref 65–99)
POTASSIUM: 4.3 mmol/L (ref 3.5–5.1)
Sodium: 139 mmol/L (ref 135–145)

## 2016-12-29 MED ORDER — LORAZEPAM 0.5 MG PO TABS: 0.5000 mg | ORAL_TABLET | Freq: Four times a day (QID) | ORAL | Status: DC | PRN

## 2016-12-29 MED ORDER — DILTIAZEM HCL ER COATED BEADS 240 MG PO CP24: 240.0000 mg | ORAL_CAPSULE | Freq: Every day | ORAL | 3 refills | Status: DC

## 2016-12-29 MED ORDER — RIVAROXABAN 20 MG PO TABS: 20.0000 mg | ORAL_TABLET | Freq: Every day | ORAL | 1 refills | Status: DC

## 2016-12-29 MED ORDER — DILTIAZEM HCL ER COATED BEADS 180 MG PO CP24
180.0000 mg | ORAL_CAPSULE | Freq: Every day | ORAL | Status: DC
  Administered 2016-12-29: 180 mg via ORAL
  Filled 2016-12-29: qty 1

## 2016-12-29 NOTE — Care Management Note (Addendum)
Case Management Note  Patient Details  Name: Cole [name] MRN: [id number] Date of Birth: 08-19-1946  Subjective/Objective:     Adm with Afib with RVR. Ind with ADL's. Still works. Starting Xarelto.                Action/Plan: Patient given Xarelto discount Voucher. No other CM needs.   Benefits check as follows: XARELTO 20 MG DAILY   COVER- YES  CO-PAY- $ 40.00 Q/L ONE PILL PER DAY  TIER- 2 DRUG  PRIOR APPROVAL- NO   Expected Discharge Date:    12/30/2016              Expected Discharge Plan:  Home/Self Care  In-House Referral:     Discharge planning Services  CM Consult, Medication Assistance  Post Acute Care Choice:  NA Choice offered to:  NA  DME Arranged:    DME Agency:     HH Arranged:    HH Agency:     Status of Service:  Completed, signed off  If discussed at [employer] of Stay Meetings, dates discussed:    Additional Comments:  Aseel Uhde, Chrystine Oiler, RN 12/29/2016, 1:36 PM

## 2016-12-29 NOTE — Care Management Obs Status (Signed)
MEDICARE OBSERVATION STATUS NOTIFICATION   Patient Details  Name: Cole [name] MRN: [id number] Date of Birth: 09/15/1945   Medicare Observation Status Notification Given:  Yes    [name], Chrystine Oiler, RN 12/29/2016, 1:34 PM

## 2016-12-29 NOTE — Progress Notes (Signed)
Progress Note  Patient Name: Cole [name] Date of Encounter: 12/29/2016  Primary Cardiologist: Wyline Mood, MD  Subjective   Feels much better. Wants to go home.  Inpatient Medications    Scheduled Meds: . diltiazem  60 mg Oral Q8H  . ipratropium-albuterol  3 mL Nebulization TID  . rivaroxaban  20 mg Oral Q supper   Continuous Infusions: . diltiazem (CARDIZEM) infusion Stopped (12/28/16 1912)   PRN Meds: acetaminophen, albuterol, ondansetron (ZOFRAN) IV   Vital Signs    Vitals:   12/29/16 0600 12/29/16 0700 12/29/16 0709 12/29/16 0723  BP: 115/64 (!) 120/57    Pulse: 70 67 73   Resp: 19 (!) 21 (!) 24   Temp:   98.4 F (36.9 C)   TempSrc:   Oral   SpO2: 95% 96% 94% 92%  Weight:      Height:        Intake/Output Summary (Last 24 hours) at 12/29/16 0724 Last data filed at 12/29/16 0500  Gross per 24 hour  Intake          1349.08 ml  Output              575 ml  Net           774.08 ml   Filed Weights   12/28/16 0703 12/28/16 1042 12/29/16 0500  Weight: 200 lb (90.7 kg) 192 lb 3.9 oz (87.2 kg) 194 lb 10.7 oz (88.3 kg)    Telemetry    NSR, Sinus bradycardia, occasional PAC and PVC's. Converted to NSR approx 7 pm last evening  Personally Reviewed  ECG    Atrial fibrillation with RVR and poor R wave progression ant- Personally Reviewed  Physical Exam   GEN: No acute distress.   Neck: No JVD Cardiac: RRR, no murmurs, rubs, or gallops.  Respiratory:Expiratory crackles in the bases. Scant wheezes  GI: Soft, nontender, non-distended  MS: No edema; No deformity. Neuro:  Nonfocal  Psych: Normal affect   Labs    Chemistry Recent Labs Lab 12/28/16 0724 12/29/16 0434  NA 136 139  K 4.2 4.3  CL 102 104  CO2 29 28  GLUCOSE 121* 115*  BUN 20 26*  CREATININE 1.22 1.17  CALCIUM 8.9 8.8*  GFRNONAA 57* >60  GFRAA >60 >60  ANIONGAP 5 7     Hematology Recent Labs Lab 12/28/16 0724 12/29/16 0434  WBC 11.0* 9.1  RBC 5.43 5.08  HGB 15.5 13.9  HCT 45.4  43.2  MCV 83.6 85.0  MCH 28.5 27.4  MCHC 34.1 32.2  RDW 13.0 13.3  PLT 227 228    Cardiac Enzymes Recent Labs Lab 12/28/16 0724 12/28/16 1131 12/28/16 1801 12/28/16 2326  TROPONINI <0.03 <0.03 <0.03 <0.03    BNP Recent Labs Lab 12/28/16 0729  BNP 56.0     DDimer No results for input(s): DDIMER in the last 168 hours.   Radiology    Dg Chest 2 View  Result Date: 12/28/2016 CLINICAL DATA:  Tachycardia EXAM: CHEST  2 VIEW COMPARISON:  10/05/2011 FINDINGS: Cardiac shadow is within normal limits. The lungs are well aerated bilaterally. No focal infiltrate is seen. Very mild interstitial changes are noted. No bony abnormality is seen. IMPRESSION: No active cardiopulmonary disease. Electronically Signed   By: Alcide Clever M.D.   On: 12/28/2016 08:17    Cardiac Studies   Echo pending  Patient Profile     71 y.o. male hx of COPD & HTN(stopped med 6 months ago) who developed diaphoresis and presyncope  and diarrhea while getting ready for work. EMS found atrial fib heart rate to be 150/m and didn't respond to Amiodarone, and was started on IV diltiazem. Afib is new. Now on Xarelto.   Assessment & Plan    1. New onset Atrial fib with RVR: Converted to NSR 7 pm last evening. Has been converted to po diltiazem 60 mg Q 8 hours. He is now on Xarelto 20 mg daily. CHADS VASC Score of 2. Echo is pending. Would like to have this done before discharge to assist in management. Patient is anxious to go home.  Will need to make follow up appointment with cardiology prior to discharge.   2.Hypertension: BP is well controlled currently.   3. COPD: Per PCP  Signed, Joni Reining, NP  12/29/2016, 7:24 AM    Attending Note Patient seen and discussed with NP Lyman Bishop, I agree with her documentation. Admitted with new onset afib, was on dilt drip, now on oral dilt  tid. Converted to NSR. Started on xarelto. Awaiting echo results. We will consolidate dilt to  daily. Ok for discharge,  we will plan for outpatient echo. We will arrange outpatient follow up in 2 weeks.      Dina Rich MD

## 2016-12-29 NOTE — Progress Notes (Signed)
Patient currently in sinus rhythm long toe with patient and wife about need to take medicines he stopped some antihypertensive 6 months ago states he never needed anti-hyperlipidemic therapy currently on Xarelto and Cardizem switched to long acting Cardizem prior to discharge charge 2-D echo scheduled for later today Cole [name] ZOX:[id number] DOB: May 04, 1946 DOA: 12/28/2016 PCP: Cole Stalling, MD   Physical Exam: Blood pressure (!) 127/53, pulse 71, temperature 97.6 F (36.4 C), temperature source Oral, resp. rate (!) 25, height  (1.803 m), weight 88.3 kg (194 lb 10.7 oz), SpO2 95 %. Lungs show prolonged respiratory phase scattered rhonchi no rales no wheezes heart rate regular rhythm no S3-S4 no heaves thrills rubs abdomen soft nontender bowel sounds normoactive   Investigations:  Recent Results (from the past 240 hour(s))  MRSA PCR Screening     Status: None   Collection Time: 12/28/16 10:56 AM  Result Value Ref Range Status   MRSA by PCR NEGATIVE NEGATIVE Final    Comment:        The GeneXpert MRSA Assay (FDA approved for NASAL specimens only), is one component of a comprehensive MRSA colonization surveillance program. It is not intended to diagnose MRSA infection nor to guide or monitor treatment for MRSA infections.      Basic Metabolic Panel:  Recent Labs  81/19/14 0724 12/28/16 0729 12/29/16 0434  NA 136  --  139  K 4.2  --  4.3  CL 102  --  104  CO2 29  --  28  GLUCOSE 121*  --  115*  BUN 20  --  26*  CREATININE 1.22  --  1.17  CALCIUM 8.9  --  8.8*  MG  --  2.0  --    Liver Function Tests: No results for input(s): AST, ALT, ALKPHOS, BILITOT, PROT, ALBUMIN in the last 72 hours.   CBC:  Recent Labs  12/28/16 0724 12/29/16 0434  WBC 11.0* 9.1  NEUTROABS 8.1*  --   HGB 15.5 13.9  HCT 45.4 43.2  MCV 83.6 85.0  PLT 227 228    Dg Chest 2 View  Result Date: 12/28/2016 CLINICAL DATA:  Tachycardia EXAM: CHEST  2 VIEW COMPARISON:  10/05/2011  FINDINGS: Cardiac shadow is within normal limits. The lungs are well aerated bilaterally. No focal infiltrate is seen. Very mild interstitial changes are noted. No bony abnormality is seen. IMPRESSION: No active cardiopulmonary disease. Electronically Signed   By: Alcide Clever M.D.   On: 12/28/2016 08:17      Medications:   Impression:  Active Problems:   COPD (chronic obstructive pulmonary disease) (HCC)   Atrial fibrillation with RVR (HCC)     Plan: 2-D echo to assess morphology of myocardium and valves as well as any motion abnormalities. This change to long-acting Cardizem prior to discharge continue Xarelto 20. Ativan 0.5 3 times a day and 1 mg at bedtime severe anxiety  Consultants: Cardiology   Procedures 2-D echo   Antibiotics:        Time spent: 30 minutes   LOS: 0 days   Cole [name] M   12/29/2016, 11:32 AM

## 2016-12-29 NOTE — Discharge Summary (Signed)
Physician Discharge Summary  Cole [name] ZOX:[id number] DOB: Oct 14, 1945 DOA: 12/28/2016  PCP: Isabella Stalling, MD  Admit date: 12/28/2016 Discharge date: 12/29/2016   Recommendations for Outpatient Follow-up:  Patient is instructed to follow-up my office tomorrow morning a.m. 4 2-D echocardiogram to assess chamber size rales and contractility. He is instructed to take Cartia 240 mg by mouth daily as well as takes Xarelto 20 mg by mouth daily he is likewise to take Combivent inhaler 2 puffs 4 times a day he will follow-up in the office again in one week's time Discharge Diagnoses:  Active Problems:   COPD (chronic obstructive pulmonary disease) (HCC)   Atrial fibrillation with RVR [facility])   Discharge Condition: Good  Filed Weights   12/28/16 0703 12/28/16 1042 12/29/16 0500  Weight: 90.7 kg (200 lb) 87.2 kg (192 lb 3.9 oz) 88.3 kg (194 lb 10.7 oz)    History of present illness:  The patient is 11 white male with history of hypertension noncompliance COPD he quit smoking several years ago he stopped his blood pressure medicine 6 months ago and was found to be in A. fib with rapid ventricular response associated with near syncope dyspnea was taken to be up and on IV Cardizem as slowed his rate eventually converted to sinus rhythm is with several oral Cardizem 60 mg every 8 hours and maintained in sinus rhythm cardiac enzymes were negative lipid status was unknown in hospital but was excellent in office he states it has not been on lipid medicine's hemodynamically stable he was switched to a long-acting form of Cartia ex R2 140 mg by mouth daily he was placed on Xarelto 20 mg by mouth daily for anticoagulation and he will continue his Combivent respiratory inhaler 2 puffs 4 times a day he'll follow-up in the office tomorrow morning Friday a.m. for a 2-D echocardiogram  Hospital Course:  See history of present illness above  Procedures:    Consultations: Cardiology  Discharge  Instructions  Discharge Instructions    Discharge instructions    Complete by:  As directed    Discharge patient    Complete by:  As directed    Discharge disposition:  01-Home or Self Care   Discharge patient date:  12/29/2016      No Known Allergies    The results of significant diagnostics from this hospitalization (including imaging, microbiology, ancillary and laboratory) are listed below for reference.    Significant Diagnostic Studies: Dg Chest 2 View  Result Date: 12/28/2016 CLINICAL DATA:  Tachycardia EXAM: CHEST  2 VIEW COMPARISON:  10/05/2011 FINDINGS: Cardiac shadow is within normal limits. The lungs are well aerated bilaterally. No focal infiltrate is seen. Very mild interstitial changes are noted. No bony abnormality is seen. IMPRESSION: No active cardiopulmonary disease. Electronically Signed   By: Alcide Clever M.D.   On: 12/28/2016 08:17    Microbiology: Recent Results (from the past 240 hour(s))  MRSA PCR Screening     Status: None   Collection Time: 12/28/16 10:56 AM  Result Value Ref Range Status   MRSA by PCR NEGATIVE NEGATIVE Final    Comment:        The GeneXpert MRSA Assay (FDA approved for NASAL specimens only), is one component of a comprehensive MRSA colonization surveillance program. It is not intended to diagnose MRSA infection nor to guide or monitor treatment for MRSA infections.      Labs: Basic Metabolic Panel:  Recent Labs Lab 12/28/16 0724 12/28/16 0729 12/29/16 0434  NA 136  --  139  K 4.2  --  4.3  CL 102  --  104  CO2 29  --  28  GLUCOSE 121*  --  115*  BUN 20  --  26*  CREATININE 1.22  --  1.17  CALCIUM 8.9  --  8.8*  MG  --  2.0  --    Liver Function Tests: No results for input(s): AST, ALT, ALKPHOS, BILITOT, PROT, ALBUMIN in the last 168 hours. No results for input(s): LIPASE, AMYLASE in the last 168 hours. No results for input(s): AMMONIA in the last 168 hours. CBC:  Recent Labs Lab 12/28/16 0724  12/29/16 0434  WBC 11.0* 9.1  NEUTROABS 8.1*  --   HGB 15.5 13.9  HCT 45.4 43.2  MCV 83.6 85.0  PLT 227 228   Cardiac Enzymes:  Recent Labs Lab 12/28/16 0724 12/28/16 1131 12/28/16 1801 12/28/16 2326  TROPONINI <0.03 <0.03 <0.03 <0.03   BNP: BNP (last 3 results)  Recent Labs  12/28/16 0729  BNP 56.0    ProBNP (last 3 results) No results for input(s): PROBNP in the last 8760 hours.  CBG: No results for input(s): GLUCAP in the last 168 hours.     Signed:  Karl Erway Judie Petit  [facility] Pager: [phone] 12/29/2016, 2:31 PM

## 2017-01-05 ENCOUNTER — Other Ambulatory Visit (HOSPITAL_COMMUNITY): Payer: Medicare Other

## 2017-01-05 ENCOUNTER — Encounter (INDEPENDENT_AMBULATORY_CARE_PROVIDER_SITE_OTHER): Payer: Self-pay | Admitting: *Deleted

## 2017-01-09 ENCOUNTER — Emergency Department (HOSPITAL_COMMUNITY)
Admission: EM | Admit: 2017-01-09 | Discharge: 2017-01-09 | Disposition: A | Discharge status: Home / Self Care | Payer: Medicare Other | Attending: Emergency Medicine | Admitting: Emergency Medicine

## 2017-01-09 ENCOUNTER — Emergency Department (HOSPITAL_COMMUNITY): Payer: Medicare Other

## 2017-01-09 ENCOUNTER — Encounter (HOSPITAL_COMMUNITY): Payer: Self-pay

## 2017-01-09 DIAGNOSIS — Z87891 Personal history of nicotine dependence: Secondary | ICD-10-CM | POA: Insufficient documentation

## 2017-01-09 DIAGNOSIS — J449 Chronic obstructive pulmonary disease, unspecified: Secondary | ICD-10-CM | POA: Diagnosis not present

## 2017-01-09 DIAGNOSIS — N3001 Acute cystitis with hematuria: Secondary | ICD-10-CM | POA: Diagnosis not present

## 2017-01-09 DIAGNOSIS — Z79899 Other long term (current) drug therapy: Secondary | ICD-10-CM | POA: Diagnosis not present

## 2017-01-09 DIAGNOSIS — K5730 Diverticulosis of large intestine without perforation or abscess without bleeding: Secondary | ICD-10-CM | POA: Diagnosis not present

## 2017-01-09 DIAGNOSIS — R319 Hematuria, unspecified: Secondary | ICD-10-CM | POA: Diagnosis present

## 2017-01-09 DIAGNOSIS — I10 Essential (primary) hypertension: Secondary | ICD-10-CM | POA: Diagnosis not present

## 2017-01-09 DIAGNOSIS — K5732 Diverticulitis of large intestine without perforation or abscess without bleeding: Secondary | ICD-10-CM

## 2017-01-09 HISTORY — DX: Unspecified atrial fibrillation: I48.91

## 2017-01-09 HISTORY — DX: Essential (primary) hypertension: I10

## 2017-01-09 LAB — URINALYSIS, ROUTINE W REFLEX MICROSCOPIC

## 2017-01-09 LAB — CBC WITH DIFFERENTIAL/PLATELET
Basophils Absolute: 0.1 10*3/uL (ref 0.0–0.1)
Basophils Relative: 1 %
EOS ABS: 0.3 10*3/uL (ref 0.0–0.7)
EOS PCT: 3 %
HEMATOCRIT: 38.8 % — AB (ref 39.0–52.0)
HEMOGLOBIN: 13.1 g/dL (ref 13.0–17.0)
LYMPHS ABS: 2 10*3/uL (ref 0.7–4.0)
Lymphocytes Relative: 20 %
MCH: 28.2 pg (ref 26.0–34.0)
MCHC: 33.8 g/dL (ref 30.0–36.0)
MCV: 83.4 fL (ref 78.0–100.0)
MONO ABS: 0.6 10*3/uL (ref 0.1–1.0)
MONOS PCT: 6 %
NEUTROS PCT: 70 %
Neutro Abs: 6.9 10*3/uL (ref 1.7–7.7)
PLATELETS: 264 10*3/uL (ref 150–400)
RBC: 4.65 MIL/uL (ref 4.22–5.81)
RDW: 12.7 % (ref 11.5–15.5)
WBC: 9.9 10*3/uL (ref 4.0–10.5)

## 2017-01-09 LAB — BASIC METABOLIC PANEL
Anion gap: 7 (ref 5–15)
BUN: 21 mg/dL — AB (ref 6–20)
CALCIUM: 8.9 mg/dL (ref 8.9–10.3)
CO2: 27 mmol/L (ref 22–32)
CREATININE: 1.21 mg/dL (ref 0.61–1.24)
Chloride: 105 mmol/L (ref 101–111)
GFR calc Af Amer: >60 mL/min (ref >60)
GFR, EST NON AFRICAN AMERICAN: 58 mL/min — AB (ref >60)
GLUCOSE: 100 mg/dL — AB (ref 65–99)
Potassium: 3.8 mmol/L (ref 3.5–5.1)
Sodium: 139 mmol/L (ref 135–145)

## 2017-01-09 LAB — URINALYSIS, MICROSCOPIC (REFLEX): Squamous Epithelial / LPF: NONE SEEN

## 2017-01-09 MED ORDER — CIPROFLOXACIN HCL 250 MG PO TABS
500.0000 mg | ORAL_TABLET | Freq: Once | ORAL | Status: AC
  Administered 2017-01-09: 500 mg via ORAL
  Filled 2017-01-09: qty 2

## 2017-01-09 MED ORDER — METRONIDAZOLE 500 MG PO TABS: 500.0000 mg | ORAL_TABLET | Freq: Two times a day (BID) | ORAL | 0 refills | Status: DC

## 2017-01-09 MED ORDER — CIPROFLOXACIN HCL 500 MG PO TABS: 500.0000 mg | ORAL_TABLET | Freq: Three times a day (TID) | ORAL | 0 refills | Status: DC

## 2017-01-09 MED ORDER — METRONIDAZOLE 500 MG PO TABS
500.0000 mg | ORAL_TABLET | Freq: Once | ORAL | Status: AC
  Administered 2017-01-09: 500 mg via ORAL
  Filled 2017-01-09: qty 1

## 2017-01-09 NOTE — ED Provider Notes (Signed)
[facility]-EMERGENCY DEPT Provider Note   CSN: [id number] Arrival date & time: 01/09/17  1343     History   Chief Complaint Chief Complaint  Patient presents with  . Hematuria    HPI Cole [name] is a 71 y.o. male.  Patient presents with the complaint of blood in the urine. Patient is on several toe for atrial fib. Symptoms started on Wednesday. Associated with some left CVA and left flank pain. No nausea vomiting no fevers. No anterior abdominal pain. Patient has a known history of a groin hernia.       Past Medical History:  Diagnosis Date  . Atrial fibrillation (HCC)   . COPD (chronic obstructive pulmonary disease) (HCC)   . Hypertension     Patient Active Problem List   Diagnosis Date Noted  . Atrial fibrillation with RVR (HCC) 12/28/2016  . CONSTIPATION 10/15/2008  . ELEVATED BLOOD PRESSURE WITHOUT DIAGNOSIS OF HYPERTENSION 05/28/2008  . CHEST PAIN 02/18/2008  . CHRONIC OBSTRUCTIVE PULMONARY DISEASE, ACUTE EXACERBATION 09/12/2007  . COPD (chronic obstructive pulmonary disease) (HCC) 09/12/2007    Past Surgical History:  Procedure Laterality Date  . HERNIA REPAIR         Home Medications    Prior to Admission medications   Medication Sig Start Date End Date Taking? Authorizing Provider  diltiazem (CARTIA XT) 240 MG 24 hr capsule Take 1 capsule (240 mg total) by mouth daily. Patient taking differently: Take 240 mg by mouth daily before breakfast.  12/29/16  Yes Oval Linsey, MD  Ipratropium-Albuterol (COMBIVENT RESPIMAT) 20-100 MCG/ACT AERS respimat Inhale 1 puff into the lungs every 6 (six) hours as needed for wheezing.   Yes Historical Provider, MD  ipratropium-albuterol (DUONEB) 0.5-2.5 (3) MG/3ML SOLN Take 3 mLs by nebulization every 4 (four) hours as needed. For shortness of breath   Yes Historical Provider, MD  rivaroxaban (XARELTO) 20 MG TABS tablet Take 1 tablet (20 mg total) by mouth daily with supper. 12/29/16  Yes Erick Blinks, MD  ciprofloxacin  (CIPRO) 500 MG tablet Take 1 tablet (500 mg total) by mouth 3 (three) times daily. 01/09/17   Vanetta Mulders, MD  metroNIDAZOLE (FLAGYL) 500 MG tablet Take 1 tablet (500 mg total) by mouth 2 (two) times daily. 01/09/17   Vanetta Mulders, MD    Family History Family History  Problem Relation Age of Onset  . Heart attack Mother 62  . Atrial fibrillation Brother     Social History Social History  Substance Use Topics  . Smoking status: Former Smoker    Quit date: 09/18/2002  . Smokeless tobacco: Never Used  . Alcohol use No     Allergies   Patient has no known allergies.   Review of Systems Review of Systems  Constitutional: Negative for fever.  HENT: Negative for congestion.   Eyes: Negative for visual disturbance.  Respiratory: Negative for shortness of breath.   Cardiovascular: Negative for chest pain.  Gastrointestinal: Positive for abdominal pain.  Genitourinary: Positive for hematuria. Negative for dysuria.  Musculoskeletal: Positive for back pain.  Skin: Negative for rash.  Neurological: Negative for headaches.  Hematological: Bruises/bleeds easily.  Psychiatric/Behavioral: Negative for confusion.     Physical Exam Updated Vital Signs BP (!) 155/71 (BP Location: Right Arm)   Pulse 77   Temp 98.1 F (36.7 C) (Oral)   Resp 20   Ht  (1.803 m)   Wt 88 kg   SpO2 93%   BMI 27.06 kg/m   Physical Exam  Constitutional: He is oriented  to person, place, and time. He appears well-developed and well-nourished. No distress.  HENT:  Head: Normocephalic and atraumatic.  Mouth/Throat: Oropharynx is clear and moist.  Eyes: Conjunctivae and EOM are normal. Pupils are equal, round, and reactive to light.  Neck: Normal range of motion. Neck supple.  Cardiovascular: Normal rate, regular rhythm and normal heart sounds.   Pulmonary/Chest: Effort normal and breath sounds normal.  Abdominal: Soft. Bowel sounds are normal. There is no tenderness.  Musculoskeletal: Normal  range of motion.  Neurological: He is alert and oriented to person, place, and time.  Skin: Skin is warm.  Nursing note and vitals reviewed.    ED Treatments / Results  Labs (all labs ordered are listed, but only abnormal results are displayed) Labs Reviewed  URINALYSIS, ROUTINE W REFLEX MICROSCOPIC - Abnormal; Notable for the following:       Result Value   Color, Urine RED (*)    APPearance CLOUDY (*)    Glucose, UA   (*)    Value: TEST NOT REPORTED DUE TO COLOR INTERFERENCE OF URINE PIGMENT   Hgb urine dipstick   (*)    Value: TEST NOT REPORTED DUE TO COLOR INTERFERENCE OF URINE PIGMENT   Bilirubin Urine   (*)    Value: TEST NOT REPORTED DUE TO COLOR INTERFERENCE OF URINE PIGMENT   Ketones, ur   (*)    Value: TEST NOT REPORTED DUE TO COLOR INTERFERENCE OF URINE PIGMENT   Protein, ur   (*)    Value: TEST NOT REPORTED DUE TO COLOR INTERFERENCE OF URINE PIGMENT   Nitrite   (*)    Value: TEST NOT REPORTED DUE TO COLOR INTERFERENCE OF URINE PIGMENT   Leukocytes, UA   (*)    Value: TEST NOT REPORTED DUE TO COLOR INTERFERENCE OF URINE PIGMENT   All other components within normal limits  URINALYSIS, MICROSCOPIC (REFLEX) - Abnormal; Notable for the following:    Bacteria, UA MANY (*)    All other components within normal limits  CBC WITH DIFFERENTIAL/PLATELET - Abnormal; Notable for the following:    HCT 38.8 (*)    All other components within normal limits  BASIC METABOLIC PANEL - Abnormal; Notable for the following:    Glucose, Bld 100 (*)    BUN 21 (*)    GFR calc non Af Amer 58 (*)    All other components within normal limits  URINE CULTURE    EKG  EKG Interpretation None       Radiology Ct Renal Stone Study  Result Date: 01/09/2017 CLINICAL DATA:  71 year old male with history of hematuria for the past 5 days. Patient is on blood thinners. EXAM: CT ABDOMEN AND PELVIS WITHOUT CONTRAST TECHNIQUE: Multidetector CT imaging of the abdomen and pelvis was performed  following the standard protocol without IV contrast. COMPARISON:  No priors. FINDINGS: Lower chest: Emphysematous changes in the lung bases bilaterally. Hepatobiliary: No definite cystic or solid hepatic lesions are noted on today's noncontrast CT examination. Unenhanced appearance of the gallbladder is normal. Pancreas: No definite pancreatic mass or peripancreatic inflammatory changes are noted on today's noncontrast CT examination. Spleen: Unremarkable. Adrenals/Urinary Tract: No calculi are identified within the collecting system of either kidney, along the course of either ureter, or within the lumen of the urinary bladder. There is no hydroureteronephrosis. Mild bilateral perinephric stranding is noted and is symmetric. Unenhanced appearance of the urinary bladder is unremarkable. Bilateral adrenal glands are normal in appearance. Stomach/Bowel: Unenhanced appearance of the stomach is normal.  There is no pathologic dilatation of small bowel or colon. Colonic diverticulae are present. Adjacent to the mid sigmoid colon there are some subtle inflammatory changes concerning for acute diverticulitis, best appreciated on axial image 52 of series 2. No diverticular abscess or signs of frank perforation are noted at this time. Normal appendix. Vascular/Lymphatic: Aortic atherosclerosis, without evidence of aneurysm in the abdominal or pelvic vasculature. Retroaortic left renal vein (normal anatomical variant) incidentally noted. No lymphadenopathy noted in the abdomen or pelvis on today's noncontrast CT examination. Reproductive: Prostate gland and seminal vesicles are unremarkable in appearance. Other: Small left inguinal hernia containing a very short segment of proximal sigmoid colon. Small umbilical hernia containing only omental fat. Musculoskeletal: There are no aggressive appearing lytic or blastic lesions noted in the visualized portions of the skeleton. IMPRESSION: 1. No urinary tract calculi. 2. Colonic  diverticulosis with some subtle inflammatory changes adjacent to the mid sigmoid colon, concerning for potential acute diverticulitis. No definite findings to suggest diverticular abscess or frank perforation at this time. 3. Nonspecific perinephric stranding bilaterally. This could indicate urinary tract infection, or can be chronically seen in some individuals. Clinical correlation is suggested. 4. Small left inguinal hernia containing a very short segment of the proximal sigmoid colon, without evidence of bowel incarceration or obstruction at this time. 5. Small umbilical hernia containing only omental fat. 6. Normal appendix. 7. Aortic atherosclerosis. Electronically Signed   By: Trudie Reed M.D.   On: 01/09/2017 16:48    Procedures Procedures (including critical care time)  Medications Ordered in ED Medications  ciprofloxacin (CIPRO) tablet 500 mg (not administered)  metroNIDAZOLE (FLAGYL) tablet 500 mg (not administered)     Initial Impression / Assessment and Plan / ED Course  I have reviewed the triage vital signs and the nursing notes.  Pertinent labs & imaging results that were available during my care of the patient were reviewed by me and considered in my medical decision making (see chart for details).   patient definitely has hematuria and a fair amount of bacteria and urine suggestive of urinary tract infection. But due to the flank pain also had CT scan to rule out kidney stone. Urine culture is pending. But will be treated for urinary tract infection.  CT scan addition raised concerns for possible mild diverticulitis. So therefore we'll treat with Cipro and Flagyl. Patient does not have a leukocytosis. No tenderness to palpation to the CVA area or left flank or abdomen area.  Incidental findings of an umbilical hernia and groin hernia. Patient was aware both of these.  Patient nontoxic no acute distress.  Patient with importance of making sure that the blood clears  from the urine.  Final Clinical Impressions(s) / ED Diagnoses   Final diagnoses:  Hematuria, unspecified type  Acute cystitis with hematuria  Diverticulitis large intestine w/o perforation or abscess w/o bleeding    New Prescriptions New Prescriptions   CIPROFLOXACIN (CIPRO) 500 MG TABLET    Take 1 tablet (500 mg total) by mouth 3 (three) times daily.   METRONIDAZOLE (FLAGYL) 500 MG TABLET    Take 1 tablet (500 mg total) by mouth 2 (two) times daily.     Vanetta Mulders, MD 01/09/17 1726

## 2017-01-09 NOTE — ED Triage Notes (Signed)
Pt reports that he started having blood in urine since last Wednesday. Blood is bright red. Pt is currently on xarelto. Denies pain

## 2017-01-09 NOTE — Discharge Instructions (Signed)
°  Take the Cipro both for the diverticulitis and for the presumed urinary tract infection. Take the Flagyl for the diverticulitis as directed. Make an appointment to follow-up with your regular doctor and make an appointment to follow-up with urology. Very important to make sure that the blood in the urine clears. Return for any new or worse symptoms.

## 2017-01-10 ENCOUNTER — Encounter (HOSPITAL_COMMUNITY): Payer: Self-pay | Admitting: Emergency Medicine

## 2017-01-10 ENCOUNTER — Emergency Department (HOSPITAL_COMMUNITY)
Admission: EM | Admit: 2017-01-10 | Discharge: 2017-01-10 | Disposition: A | Discharge status: Home / Self Care | Payer: Medicare Other | Attending: Emergency Medicine | Admitting: Emergency Medicine

## 2017-01-10 DIAGNOSIS — Z79899 Other long term (current) drug therapy: Secondary | ICD-10-CM | POA: Insufficient documentation

## 2017-01-10 DIAGNOSIS — I10 Essential (primary) hypertension: Secondary | ICD-10-CM | POA: Diagnosis not present

## 2017-01-10 DIAGNOSIS — Z87891 Personal history of nicotine dependence: Secondary | ICD-10-CM | POA: Insufficient documentation

## 2017-01-10 DIAGNOSIS — R319 Hematuria, unspecified: Secondary | ICD-10-CM | POA: Insufficient documentation

## 2017-01-10 DIAGNOSIS — R1031 Right lower quadrant pain: Secondary | ICD-10-CM | POA: Diagnosis present

## 2017-01-10 DIAGNOSIS — J449 Chronic obstructive pulmonary disease, unspecified: Secondary | ICD-10-CM | POA: Diagnosis not present

## 2017-01-10 DIAGNOSIS — K5792 Diverticulitis of intestine, part unspecified, without perforation or abscess without bleeding: Secondary | ICD-10-CM | POA: Insufficient documentation

## 2017-01-10 LAB — CBC WITH DIFFERENTIAL/PLATELET
BASOS ABS: 0.1 10*3/uL (ref 0.0–0.1)
BASOS PCT: 1 %
EOS ABS: 0.1 10*3/uL (ref 0.0–0.7)
EOS PCT: 1 %
HCT: 38.2 % — ABNORMAL LOW (ref 39.0–52.0)
Hemoglobin: 12.9 g/dL — ABNORMAL LOW (ref 13.0–17.0)
Lymphocytes Relative: 12 %
Lymphs Abs: 1.6 10*3/uL (ref 0.7–4.0)
MCH: 28.2 pg (ref 26.0–34.0)
MCHC: 33.8 g/dL (ref 30.0–36.0)
MCV: 83.6 fL (ref 78.0–100.0)
MONO ABS: 0.5 10*3/uL (ref 0.1–1.0)
MONOS PCT: 4 %
NEUTROS ABS: 10.4 10*3/uL — AB (ref 1.7–7.7)
Neutrophils Relative %: 82 %
PLATELETS: 274 10*3/uL (ref 150–400)
RBC: 4.57 MIL/uL (ref 4.22–5.81)
RDW: 12.7 % (ref 11.5–15.5)
WBC: 12.7 10*3/uL — ABNORMAL HIGH (ref 4.0–10.5)

## 2017-01-10 LAB — URINALYSIS, ROUTINE W REFLEX MICROSCOPIC
GLUCOSE, UA: 250 mg/dL — AB
Ketones, ur: 15 mg/dL — AB
Nitrite: POSITIVE — AB
SPECIFIC GRAVITY, URINE: 1.02 (ref 1.005–1.030)
pH: 7 (ref 5.0–8.0)

## 2017-01-10 LAB — COMPREHENSIVE METABOLIC PANEL
ALBUMIN: 3.7 g/dL (ref 3.5–5.0)
ALT: 24 U/L (ref 17–63)
ANION GAP: 7 (ref 5–15)
AST: 18 U/L (ref 15–41)
Alkaline Phosphatase: 90 U/L (ref 38–126)
BILIRUBIN TOTAL: 0.7 mg/dL (ref 0.3–1.2)
BUN: 26 mg/dL — AB (ref 6–20)
CHLORIDE: 104 mmol/L (ref 101–111)
CO2: 28 mmol/L (ref 22–32)
Calcium: 9.2 mg/dL (ref 8.9–10.3)
Creatinine, Ser: 1.49 mg/dL — ABNORMAL HIGH (ref 0.61–1.24)
GFR calc Af Amer: 52 mL/min — ABNORMAL LOW (ref >60)
GFR calc non Af Amer: 45 mL/min — ABNORMAL LOW (ref >60)
GLUCOSE: 148 mg/dL — AB (ref 65–99)
POTASSIUM: 4.1 mmol/L (ref 3.5–5.1)
Sodium: 139 mmol/L (ref 135–145)
TOTAL PROTEIN: 7.1 g/dL (ref 6.5–8.1)

## 2017-01-10 LAB — URINE CULTURE: CULTURE: NO GROWTH

## 2017-01-10 LAB — URINALYSIS, MICROSCOPIC (REFLEX)

## 2017-01-10 MED ORDER — HYDROMORPHONE HCL 1 MG/ML IJ SOLN
0.5000 mg | Freq: Once | INTRAMUSCULAR | Status: AC
  Administered 2017-01-10: 0.5 mg via INTRAVENOUS
  Filled 2017-01-10: qty 1

## 2017-01-10 MED ORDER — OXYCODONE-ACETAMINOPHEN 5-325 MG PO TABS: 1.0000 | ORAL_TABLET | Freq: Four times a day (QID) | ORAL | 0 refills | Status: DC | PRN

## 2017-01-10 MED ORDER — SODIUM CHLORIDE 0.9 % IV BOLUS (SEPSIS)
1000.0000 mL | Freq: Once | INTRAVENOUS | Status: AC
  Administered 2017-01-10: 1000 mL via INTRAVENOUS

## 2017-01-10 MED ORDER — HYDROCODONE-ACETAMINOPHEN 5-325 MG PO TABS: 1.0000 | ORAL_TABLET | Freq: Four times a day (QID) | ORAL | 0 refills | Status: DC | PRN

## 2017-01-10 MED ORDER — ONDANSETRON 4 MG PO TBDP
4.0000 mg | ORAL_TABLET | Freq: Once | ORAL | Status: AC
Start: 2017-01-10 — End: 2017-01-10
  Administered 2017-01-10: 4 mg via ORAL
  Filled 2017-01-10: qty 1

## 2017-01-10 MED ORDER — ONDANSETRON HCL 4 MG/2ML IJ SOLN
4.0000 mg | Freq: Once | INTRAMUSCULAR | Status: AC
  Administered 2017-01-10: 4 mg via INTRAVENOUS
  Filled 2017-01-10: qty 2

## 2017-01-10 MED ORDER — ONDANSETRON 4 MG PO TBDP: ORAL_TABLET | ORAL | 0 refills | Status: DC

## 2017-01-10 NOTE — Discharge Instructions (Signed)
Stop taking your xarelto follow-up with your doctor Friday as planned. Return sooner if high fever severe pain or he cannot keep her medicines down

## 2017-01-10 NOTE — ED Triage Notes (Signed)
Here yesterday and received 2 abx for UTI. Started having abd pain today after taking his abx today.  A/o. cbg 90

## 2017-01-10 NOTE — ED Provider Notes (Signed)
[facility] DEPT Provider Note   CSN: [id number] Arrival date & time: 01/10/17  1449     History   Chief Complaint Chief Complaint  Patient presents with  . Abdominal Pain    HPI Cole [name] is a 71 y.o. male.  Patient was diagnosed with diverticulitis yesterday. He returns today because he is having worsening abdominal discomfort. He states he threw up once but he did not throw up his antibiotics. Patient is taking Xarelto and has been having blood in his urine for a number days.   The history is provided by the patient. No language interpreter was used.  Abdominal Pain   This is a recurrent problem. The current episode started yesterday. The problem occurs constantly. The problem has not changed since onset.The pain is associated with an unknown factor. The pain is located in the RLQ. The quality of the pain is aching. Associated symptoms include hematuria. Pertinent negatives include diarrhea, frequency and headaches.    Past Medical History:  Diagnosis Date  . Atrial fibrillation (HCC)   . COPD (chronic obstructive pulmonary disease) (HCC)   . Hypertension     Patient Active Problem List   Diagnosis Date Noted  . Atrial fibrillation with RVR (HCC) 12/28/2016  . CONSTIPATION 10/15/2008  . ELEVATED BLOOD PRESSURE WITHOUT DIAGNOSIS OF HYPERTENSION 05/28/2008  . CHEST PAIN 02/18/2008  . CHRONIC OBSTRUCTIVE PULMONARY DISEASE, ACUTE EXACERBATION 09/12/2007  . COPD (chronic obstructive pulmonary disease) (HCC) 09/12/2007    Past Surgical History:  Procedure Laterality Date  . HERNIA REPAIR         Home Medications    Prior to Admission medications   Medication Sig Start Date End Date Taking? Authorizing Provider  ciprofloxacin (CIPRO) 500 MG tablet Take 1 tablet (500 mg total) by mouth 3 (three) times daily. 01/09/17  Yes Vanetta Mulders, MD  diltiazem (CARTIA XT) 240 MG 24 hr capsule Take 1 capsule (240 mg total) by mouth daily. Patient taking differently: Take  240 mg by mouth daily before breakfast.  12/29/16  Yes Oval Linsey, MD  Ipratropium-Albuterol (COMBIVENT RESPIMAT) 20-100 MCG/ACT AERS respimat Inhale 1 puff into the lungs every 6 (six) hours as needed for wheezing.   Yes Historical Provider, MD  ipratropium-albuterol (DUONEB) 0.5-2.5 (3) MG/3ML SOLN Take 3 mLs by nebulization every 4 (four) hours as needed. For shortness of breath   Yes Historical Provider, MD  metroNIDAZOLE (FLAGYL) 500 MG tablet Take 1 tablet (500 mg total) by mouth 2 (two) times daily. 01/09/17  Yes Vanetta Mulders, MD  rivaroxaban (XARELTO) 20 MG TABS tablet Take 1 tablet (20 mg total) by mouth daily with supper. 12/29/16  Yes Erick Blinks, MD  HYDROcodone-acetaminophen (NORCO/VICODIN) 5-325 MG tablet Take 1 tablet by mouth every 6 (six) hours as needed for moderate pain. 01/10/17   Bethann Berkshire, MD  ondansetron (ZOFRAN ODT) 4 MG disintegrating tablet  ODT q4 hours prn nausea/vomit 01/10/17   Bethann Berkshire, MD    Family History Family History  Problem Relation Age of Onset  . Heart attack Mother 56  . Atrial fibrillation Brother     Social History Social History  Substance Use Topics  . Smoking status: Former Smoker    Quit date: 09/18/2002  . Smokeless tobacco: Never Used  . Alcohol use No     Allergies   Patient has no known allergies.   Review of Systems Review of Systems  Constitutional: Negative for appetite change and fatigue.  HENT: Negative for congestion, ear discharge and sinus pressure.  Eyes: Negative for discharge.  Respiratory: Negative for cough.   Cardiovascular: Negative for chest pain.  Gastrointestinal: Positive for abdominal pain. Negative for diarrhea.  Genitourinary: Positive for hematuria. Negative for frequency.  Musculoskeletal: Negative for back pain.  Skin: Negative for rash.  Neurological: Negative for seizures and headaches.  Psychiatric/Behavioral: Negative for hallucinations.     Physical Exam Updated Vital  Signs BP (!) 165/90 (BP Location: Right Arm)   Pulse 60   Temp 97.6 F (36.4 C) (Oral)   Resp (!) 22   Ht  (1.803 m)   Wt 190 lb (86.2 kg)   SpO2 96%   BMI 26.50 kg/m   Physical Exam  Constitutional: He is oriented to person, place, and time. He appears well-developed.  HENT:  Head: Normocephalic.  Eyes: Conjunctivae and EOM are normal. No scleral icterus.  Neck: Neck supple. No thyromegaly present.  Cardiovascular: Normal rate and regular rhythm.  Exam reveals no gallop and no friction rub.   No murmur heard. Pulmonary/Chest: No stridor. He has no wheezes. He has no rales. He exhibits no tenderness.  Abdominal: He exhibits no distension. There is tenderness. There is no rebound.  Moderate right lower quadrant tenderness.  Musculoskeletal: Normal range of motion. He exhibits no edema.  Lymphadenopathy:    He has no cervical adenopathy.  Neurological: He is oriented to person, place, and time. He exhibits normal muscle tone. Coordination normal.  Skin: No rash noted. No erythema.  Psychiatric: He has a normal mood and affect. His behavior is normal.     ED Treatments / Results  Labs (all labs ordered are listed, but only abnormal results are displayed) Labs Reviewed  CBC WITH DIFFERENTIAL/PLATELET - Abnormal; Notable for the following:       Result Value   WBC 12.7 (*)    Hemoglobin 12.9 (*)    HCT 38.2 (*)    Neutro Abs 10.4 (*)    All other components within normal limits  COMPREHENSIVE METABOLIC PANEL - Abnormal; Notable for the following:    Glucose, Bld 148 (*)    BUN 26 (*)    Creatinine, Ser 1.49 (*)    GFR calc non Af Amer 45 (*)    GFR calc Af Amer 52 (*)    All other components within normal limits  URINALYSIS, ROUTINE W REFLEX MICROSCOPIC - Abnormal; Notable for the following:    Color, Urine RED (*)    APPearance HAZY (*)    Glucose, UA 250 (*)    Hgb urine dipstick LARGE (*)    Bilirubin Urine MODERATE (*)    Ketones, ur 15 (*)    Protein, ur  >300 (*)    Nitrite POSITIVE (*)    Leukocytes, UA MODERATE (*)    All other components within normal limits  URINALYSIS, MICROSCOPIC (REFLEX) - Abnormal; Notable for the following:    Bacteria, UA MANY (*)    Squamous Epithelial / LPF 0-5 (*)    All other components within normal limits    EKG  EKG Interpretation None       Radiology Ct Renal Stone Study  Result Date: 01/09/2017 CLINICAL DATA:  71 year old male with history of hematuria for the past 5 days. Patient is on blood thinners. EXAM: CT ABDOMEN AND PELVIS WITHOUT CONTRAST TECHNIQUE: Multidetector CT imaging of the abdomen and pelvis was performed following the standard protocol without IV contrast. COMPARISON:  No priors. FINDINGS: Lower chest: Emphysematous changes in the lung bases bilaterally. Hepatobiliary: No definite cystic  or solid hepatic lesions are noted on today's noncontrast CT examination. Unenhanced appearance of the gallbladder is normal. Pancreas: No definite pancreatic mass or peripancreatic inflammatory changes are noted on today's noncontrast CT examination. Spleen: Unremarkable. Adrenals/Urinary Tract: No calculi are identified within the collecting system of either kidney, along the course of either ureter, or within the lumen of the urinary bladder. There is no hydroureteronephrosis. Mild bilateral perinephric stranding is noted and is symmetric. Unenhanced appearance of the urinary bladder is unremarkable. Bilateral adrenal glands are normal in appearance. Stomach/Bowel: Unenhanced appearance of the stomach is normal. There is no pathologic dilatation of small bowel or colon. Colonic diverticulae are present. Adjacent to the mid sigmoid colon there are some subtle inflammatory changes concerning for acute diverticulitis, best appreciated on axial image 52 of series 2. No diverticular abscess or signs of frank perforation are noted at this time. Normal appendix. Vascular/Lymphatic: Aortic atherosclerosis, without  evidence of aneurysm in the abdominal or pelvic vasculature. Retroaortic left renal vein (normal anatomical variant) incidentally noted. No lymphadenopathy noted in the abdomen or pelvis on today's noncontrast CT examination. Reproductive: Prostate gland and seminal vesicles are unremarkable in appearance. Other: Small left inguinal hernia containing a very short segment of proximal sigmoid colon. Small umbilical hernia containing only omental fat. Musculoskeletal: There are no aggressive appearing lytic or blastic lesions noted in the visualized portions of the skeleton. IMPRESSION: 1. No urinary tract calculi. 2. Colonic diverticulosis with some subtle inflammatory changes adjacent to the mid sigmoid colon, concerning for potential acute diverticulitis. No definite findings to suggest diverticular abscess or frank perforation at this time. 3. Nonspecific perinephric stranding bilaterally. This could indicate urinary tract infection, or can be chronically seen in some individuals. Clinical correlation is suggested. 4. Small left inguinal hernia containing a very short segment of the proximal sigmoid colon, without evidence of bowel incarceration or obstruction at this time. 5. Small umbilical hernia containing only omental fat. 6. Normal appendix. 7. Aortic atherosclerosis. Electronically Signed   By: Trudie Reed M.D.   On: 01/09/2017 16:48    Procedures Procedures (including critical care time)  Medications Ordered in ED Medications  sodium chloride 0.9 % bolus 1,000 mL (1,000 mLs Intravenous New Bag/Given 01/10/17 1550)  HYDROmorphone (DILAUDID) injection 0.5 mg (0.5 mg Intravenous Given 01/10/17 1546)  ondansetron (ZOFRAN) injection 4 mg (4 mg Intravenous Given 01/10/17 1546)     Initial Impression / Assessment and Plan / ED Course  I have reviewed the triage vital signs and the nursing notes.  Pertinent labs & imaging results that were available during my care of the patient were reviewed by me  and considered in my medical decision making (see chart for details).     Patient with diverticulitis. Patient pain improved but 1 dose of Dilaudid. Patient prefers to be discharged home. I spoke with his primary care doctor who agrees with stopping the Cymbalta. I will write him for some Vicodin and Zofran and he is to follow-up on Friday or contact his doctor sooner if any problems  Final Clinical Impressions(s) / ED Diagnoses   Final diagnoses:  Diverticulitis    New Prescriptions New Prescriptions   HYDROCODONE-ACETAMINOPHEN (NORCO/VICODIN) 5-325 MG TABLET    Take 1 tablet by mouth every 6 (six) hours as needed for moderate pain.   ONDANSETRON (ZOFRAN ODT) 4 MG DISINTEGRATING TABLET     ODT q4 hours prn nausea/vomit     Bethann Berkshire, MD 01/10/17 1800

## 2017-01-12 ENCOUNTER — Encounter: Payer: Medicare Other | Admitting: Student

## 2017-01-17 MED FILL — Oxycodone w/ Acetaminophen Tab 5-325 MG: ORAL | Qty: 6 | Status: AC

## 2017-01-19 ENCOUNTER — Telehealth (INDEPENDENT_AMBULATORY_CARE_PROVIDER_SITE_OTHER): Payer: Self-pay | Admitting: *Deleted

## 2017-01-19 ENCOUNTER — Encounter (INDEPENDENT_AMBULATORY_CARE_PROVIDER_SITE_OTHER): Payer: Self-pay | Admitting: *Deleted

## 2017-01-19 ENCOUNTER — Ambulatory Visit (INDEPENDENT_AMBULATORY_CARE_PROVIDER_SITE_OTHER): Payer: Medicare Other | Admitting: Internal Medicine

## 2017-01-19 ENCOUNTER — Encounter (INDEPENDENT_AMBULATORY_CARE_PROVIDER_SITE_OTHER): Payer: Self-pay | Admitting: Internal Medicine

## 2017-01-19 ENCOUNTER — Other Ambulatory Visit (INDEPENDENT_AMBULATORY_CARE_PROVIDER_SITE_OTHER): Payer: Self-pay | Admitting: Internal Medicine

## 2017-01-19 VITALS — BP 140/58 | HR 60 | Temp 97.7°F | Ht 71.0 in | Wt 201.4 lb

## 2017-01-19 DIAGNOSIS — K5732 Diverticulitis of large intestine without perforation or abscess without bleeding: Secondary | ICD-10-CM | POA: Diagnosis not present

## 2017-01-19 NOTE — Progress Notes (Signed)
   Subjective:    Patient ID: Cole [name], male    DOB: 12/05/1945, 71 y.o.   MRN: [id number]  HPI Referred by Dr. Janna ArchonDiego for colonoscopy. Recent hx of diverticulitis and covered with Cipro and Flagyl for 7 days. He tells me is doing pretty good now. He denies any abdominal pain. He had LLQ pain radiating into his back which has now resolved. He also had hematuria. No hematuria now.  He says he is going to be referred to a Urologist for his hx of hematuria. His appetite is good. No weight loss.  Has never undergone colonoscopy in the past.  No family hx of colon cancer.  01/09/2017 CT  Renal Stone Study: hematuria. 1. No urinary tract calculi. 2. Colonic diverticulosis with some subtle inflammatory changes adjacent to the mid sigmoid colon, concerning for potential acute diverticulitis. No definite findings to suggest diverticular abscess or frank perforation at this time. 3. Nonspecific perinephric stranding bilaterally. This could indicate urinary tract infection, or can be chronically seen in some individuals. Clinical correlation is suggested.  Hx of atrial fib.   Review of Systems     Past Medical History:  Diagnosis Date  . Atrial fibrillation (HCC)   . COPD (chronic obstructive pulmonary disease) (HCC)   . Hypertension     Past Surgical History:  Procedure Laterality Date  . HERNIA REPAIR      No Known Allergies  Current Outpatient Prescriptions on File Prior to Visit  Medication Sig Dispense Refill  . diltiazem (CARTIA XT) 240 MG 24 hr capsule Take 1 capsule (240 mg total) by mouth daily. (Patient taking differently: Take 240 mg by mouth daily before breakfast. ) 30 capsule 3  . HYDROcodone-acetaminophen (NORCO/VICODIN) 5-325 MG tablet Take 1 tablet by mouth every 6 (six) hours as needed for moderate pain. 20 tablet 0  . ipratropium-albuterol (DUONEB) 0.5-2.5 (3) MG/3ML SOLN Take 3 mLs by nebulization every 4 (four) hours as needed. For shortness of breath    .  Ipratropium-Albuterol (COMBIVENT RESPIMAT) 20-100 MCG/ACT AERS respimat Inhale 1 puff into the lungs every 6 (six) hours as needed for wheezing.     No current facility-administered medications on file prior to visit.     Objective:   Physical Exam Blood pressure (!) 140/58, pulse 60, temperature 97.7 F (36.5 C), height 5\' 11"  (1.803 m), weight 201 lb 6.4 oz (91.4 kg). Alert and oriented. Skin warm and dry. Oral mucosa is moist.   . Sclera anicteric, conjunctivae is pink. Thyroid not enlarged. No cervical lymphadenopathy. Lungs clear. Heart regular rate and rhythm.  Abdomen is soft. Bowel sounds are positive. No hepatomegaly. No abdominal masses felt. No tenderness.  No edema to lower extremities.        Assessment & Plan:  Diverticulitis. Resolved. Need surveillance colonoscopy to rule out underlying neoplasm.

## 2017-01-19 NOTE — Patient Instructions (Signed)
Colonscopy. The risks and benefits such as perforation, bleeding, and infection were reviewed with the patient and is agreeable. 

## 2017-01-19 NOTE — Telephone Encounter (Signed)
Patient needs trilyte 

## 2017-01-23 MED ORDER — PEG 3350-KCL-NA BICARB-NACL 420 G PO SOLR: 4000.0000 mL | Freq: Once | ORAL | 0 refills | Status: AC

## 2017-04-13 ENCOUNTER — Encounter (HOSPITAL_COMMUNITY)
Admission: RE | Disposition: A | Discharge status: Home / Self Care | Payer: Self-pay | Source: Ambulatory Visit | Attending: Internal Medicine

## 2017-04-13 ENCOUNTER — Ambulatory Visit (HOSPITAL_COMMUNITY)
Admission: RE | Admit: 2017-04-13 | Discharge: 2017-04-13 | Disposition: A | Discharge status: Home / Self Care | Payer: Medicare Other | Source: Ambulatory Visit | Attending: Internal Medicine | Admitting: Internal Medicine

## 2017-04-13 ENCOUNTER — Encounter (HOSPITAL_COMMUNITY): Payer: Self-pay | Admitting: *Deleted

## 2017-04-13 DIAGNOSIS — Z79899 Other long term (current) drug therapy: Secondary | ICD-10-CM | POA: Diagnosis not present

## 2017-04-13 DIAGNOSIS — Z87891 Personal history of nicotine dependence: Secondary | ICD-10-CM | POA: Insufficient documentation

## 2017-04-13 DIAGNOSIS — I10 Essential (primary) hypertension: Secondary | ICD-10-CM | POA: Insufficient documentation

## 2017-04-13 DIAGNOSIS — K5730 Diverticulosis of large intestine without perforation or abscess without bleeding: Secondary | ICD-10-CM | POA: Diagnosis not present

## 2017-04-13 DIAGNOSIS — I4891 Unspecified atrial fibrillation: Secondary | ICD-10-CM | POA: Diagnosis not present

## 2017-04-13 DIAGNOSIS — K5732 Diverticulitis of large intestine without perforation or abscess without bleeding: Secondary | ICD-10-CM

## 2017-04-13 DIAGNOSIS — Z09 Encounter for follow-up examination after completed treatment for conditions other than malignant neoplasm: Secondary | ICD-10-CM | POA: Insufficient documentation

## 2017-04-13 DIAGNOSIS — K644 Residual hemorrhoidal skin tags: Secondary | ICD-10-CM | POA: Insufficient documentation

## 2017-04-13 DIAGNOSIS — Z8249 Family history of ischemic heart disease and other diseases of the circulatory system: Secondary | ICD-10-CM | POA: Insufficient documentation

## 2017-04-13 DIAGNOSIS — K621 Rectal polyp: Secondary | ICD-10-CM | POA: Diagnosis not present

## 2017-04-13 DIAGNOSIS — J449 Chronic obstructive pulmonary disease, unspecified: Secondary | ICD-10-CM | POA: Diagnosis not present

## 2017-04-13 HISTORY — PX: POLYPECTOMY: SHX5525

## 2017-04-13 HISTORY — PX: COLONOSCOPY: SHX5424

## 2017-04-13 SURGERY — COLONOSCOPY
Anesthesia: Moderate Sedation

## 2017-04-13 MED ORDER — MIDAZOLAM HCL 5 MG/5ML IJ SOLN
INTRAMUSCULAR | Status: AC
  Filled 2017-04-13: qty 10

## 2017-04-13 MED ORDER — MEPERIDINE HCL 50 MG/ML IJ SOLN
INTRAMUSCULAR | Status: DC | PRN
  Administered 2017-04-13 (×2): 25 mg via INTRAVENOUS

## 2017-04-13 MED ORDER — MIDAZOLAM HCL 5 MG/5ML IJ SOLN
INTRAMUSCULAR | Status: DC | PRN
  Administered 2017-04-13 (×2): 2 mg via INTRAVENOUS

## 2017-04-13 MED ORDER — STERILE WATER FOR IRRIGATION IR SOLN
Status: DC | PRN
  Administered 2017-04-13: 12:00:00

## 2017-04-13 MED ORDER — SODIUM CHLORIDE 0.9 % IV SOLN
INTRAVENOUS | Status: DC
  Administered 2017-04-13: 12:00:00 via INTRAVENOUS

## 2017-04-13 MED ORDER — MEPERIDINE HCL 50 MG/ML IJ SOLN
INTRAMUSCULAR | Status: AC
  Filled 2017-04-13: qty 1

## 2017-04-13 NOTE — H&P (Addendum)
Cole [name] is an 71 y.o. male.   Chief Complaint: Patient is here for colonoscopy. HPI: Patient is 71 year old Caucasian male was diagnosed with sigmoid diverticulitis in April 2018 when he was seen in emergency room. Patient was treated with antibiotics with resolution of his pain. He denies abdominal pain melena or rectal bleeding. He has never been screened for CRC. Family history is negative first CRC.  Past Medical History:  Diagnosis Date  . Atrial fibrillation (HCC)   . COPD (chronic obstructive pulmonary disease) (HCC)   . Hypertension     Past Surgical History:  Procedure Laterality Date  . HERNIA REPAIR      Family History  Problem Relation Age of Onset  . Heart attack Mother [id number]  . Atrial fibrillation Brother    Social History:  reports that he quit smoking about 14 years ago. He has never used smokeless tobacco. He reports that he does not drink alcohol or use drugs.  Allergies: No Known Allergies  Medications Prior to Admission  Medication Sig Dispense Refill  . Cholecalciferol (VITAMIN D3) 5000 units CAPS Take 5,000 Units by mouth daily.    Marland Kitchen. diltiazem (CARTIA XT) 240 MG 24 hr capsule Take 1 capsule (240 mg total) by mouth daily. (Patient taking differently: Take 240 mg by mouth daily before breakfast. ) 30 capsule 3  . Ipratropium-Albuterol (COMBIVENT RESPIMAT) 20-100 MCG/ACT AERS respimat Inhale 1 puff into the lungs every 6 (six) hours as needed for wheezing.    . tamsulosin (FLOMAX) 0.4 MG CAPS capsule Take 0.4 mg by mouth daily.     Marland Kitchen. HYDROcodone-acetaminophen (NORCO/VICODIN) 5-325 MG tablet Take 1 tablet by mouth every 6 (six) hours as needed for moderate pain. (Patient not taking: Reported on 04/12/2017) 20 tablet 0  . ipratropium-albuterol (DUONEB) 0.5-2.5 (3) MG/3ML SOLN Take 3 mLs by nebulization every 4 (four) hours as needed (shortness of breath). For shortness of breath       No results found for this or any previous visit (from the past 48 hour(s)). No  results found.  ROS  Blood pressure 140/78, pulse 75, temperature 97.9 F (36.6 C), temperature source Oral, resp. rate (!) 26, height 5' 10.5" (1.791 m), weight 200 lb (90.7 kg), SpO2 94 %. Physical Exam  Constitutional: He is oriented to person, place, and time. He appears well-developed and well-nourished.  HENT:  Mouth/Throat: Oropharynx is clear and moist.  Eyes: Conjunctivae are normal. No scleral icterus.  Neck: No thyromegaly present.  Cardiovascular:  Irregular rhythm normal S1 and S2. No murmur or gallop noted.  Respiratory:  Vesicular breath sounds bilaterally with few rhonchi at right base.  GI:  Abdomen is full with 2 small areas of ecchymosis lower abdomen. Abdomen is soft and nontender without organomegaly or masses.  Musculoskeletal: He exhibits no edema.  Neurological: He is alert and oriented to person, place, and time.  Skin: Skin is warm and dry.     Assessment/Plan History of sigmoid diverticulitis. Diagnostic colonoscopy.  Cole DecemberNajeeb Katiejo Gilroy, MD 04/13/2017, 12:05 PM

## 2017-04-13 NOTE — Op Note (Signed)
[facility] Patient Name: Cole [name] Procedure Date: 04/13/2017 11:53 AM MRN: [id number] Date of Birth: 1945/11/03 Attending MD: Lionel December , MD CSN: [id number] Age: 71 Admit Type: Outpatient Procedure:                Colonoscopy Indications:              Follow-up of diverticulitis Providers:                Lionel December, MD, Criselda Peaches. Patsy Lager, RN, Burke Keels, Technician Referring MD:             Duke Salvia. Dondiego, MD Medicines:                Meperidine 50 mg IV, Midazolam 4 mg IV Complications:            No immediate complications. Estimated Blood Loss:     Estimated blood loss was minimal. Procedure:                Pre-Anesthesia Assessment:                           - Prior to the procedure, a History and Physical                            was performed, and patient medications and                            allergies were reviewed. The patient's tolerance of                            previous anesthesia was also reviewed. The risks                            and benefits of the procedure and the sedation                            options and risks were discussed with the patient.                            All questions were answered, and informed consent                            was obtained. Prior Anticoagulants: The patient has                            taken no previous anticoagulant or antiplatelet                            agents. ASA Grade Assessment: III - A patient with                            severe systemic disease. After reviewing the risks  and benefits, the patient was deemed in                            satisfactory condition to undergo the procedure.                           After obtaining informed consent, the colonoscope                            was passed under direct vision. Throughout the                            procedure, the patient's blood pressure, pulse, and                  oxygen saturations were monitored continuously. The                            [id number] ([id number] scope was introduced through                            the anus and advanced to the the cecum, identified                            by appendiceal orifice and ileocecal valve. The                            colonoscopy was somewhat difficult due to                            inadequate bowel prep. The patient tolerated the                            procedure well. The quality of the bowel                            preparation was adequate to identify polyps 6 mm                            and larger in size. The ileocecal valve,                            appendiceal orifice, and rectum were photographed. Scope In: 12:16:09 PM Scope Out: 12:44:34 PM Scope Withdrawal Time: 0 hours 8 minutes 51 seconds  Total Procedure Duration: 0 hours 28 minutes 25 seconds  Findings:      The perianal and digital rectal examinations were normal.      Multiple medium-mouthed diverticula were found in the sigmoid colon.      A small polyp was found in the rectum. The polyp was sessile. Biopsies       were taken with a cold forceps for histology.      External hemorrhoids were found during retroflexion. The hemorrhoids       were small. Impression:               - Diverticulosis in the sigmoid colon.                           -  One small polyp in the rectum. Biopsied.                           - External hemorrhoids. Moderate Sedation:      Moderate (conscious) sedation was administered by the endoscopy nurse       and supervised by the endoscopist. The following parameters were       monitored: oxygen saturation, heart rate, blood pressure, CO2       capnography and response to care. Total physician intraservice time was       32 minutes. Recommendation:           - Patient has a contact number available for                            emergencies. The signs and symptoms of potential                             delayed complications were discussed with the                            patient. Return to normal activities tomorrow.                            Written discharge instructions were provided to the                            patient.                           - High fiber diet today.                           - Continue present medications.                           - Await pathology results.                           - No repeat colonoscopy due to age. Procedure Code(s):        --- Professional ---                           [id number], Colonoscopy, flexible; with biopsy, single                            or multiple                           99152, Moderate sedation services provided by the                            same physician or other qualified health care                            professional performing the diagnostic or  therapeutic service that the sedation supports,                            requiring the presence of an independent trained                            observer to assist in the monitoring of the                            patient's level of consciousness and physiological                            status; initial 15 minutes of intraservice time,                            patient age 34 years or older                           [phone], Moderate sedation services; each additional                            15 minutes intraservice time Diagnosis Code(s):        --- Professional ---                           K64.4, Residual hemorrhoidal skin tags                           K62.1, Rectal polyp                           K57.32, Diverticulitis of large intestine without                            perforation or abscess without bleeding                           K57.30, Diverticulosis of large intestine without                            perforation or abscess without bleeding CPT copyright 2016 [employer].  All rights reserved. The codes documented in this report are preliminary and upon coder review may  be revised to meet current compliance requirements. Lionel December, MD Lionel December, MD 04/13/2017 12:52:34 PM This report has been signed electronically. Number of Addenda: 0

## 2017-04-18 ENCOUNTER — Encounter (HOSPITAL_COMMUNITY): Payer: Self-pay | Admitting: Internal Medicine

## 2017-09-26 IMAGING — CT CT RENAL STONE PROTOCOL
2 of 4 series · 15 of 46 positions shown, 17 images · non-contrast
Comparison: No priors.

CLINICAL DATA: 71-year-old male with history of hematuria for the
past 5 days. Patient is on blood thinners.

EXAM:
CT ABDOMEN AND PELVIS WITHOUT CONTRAST
TECHNIQUE: Multidetector CT imaging of the abdomen and pelvis was performed
following the standard protocol without IV contrast.

[Series 2: axial st · axial · 0.79mm/px · z∈[+777,+1172]mm · 12 of 87 slices shown, 14 images]
[im 4/87  soft-tissue]
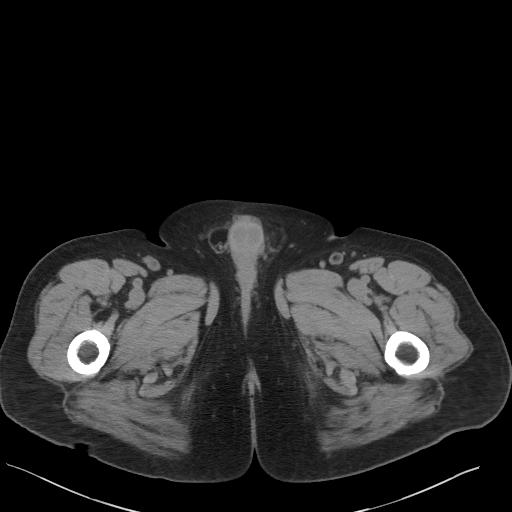
[im 4/87  bone]
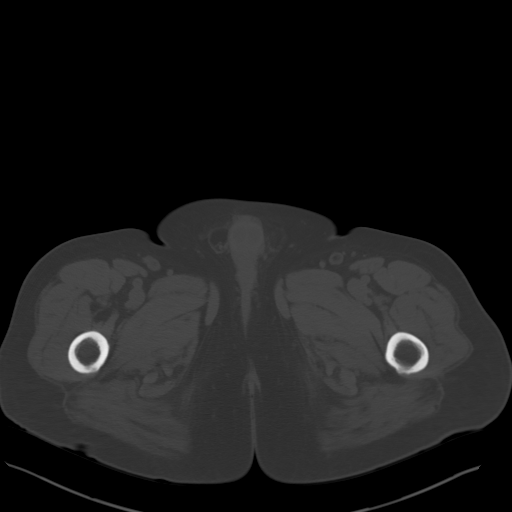
[im 11/87  soft-tissue]
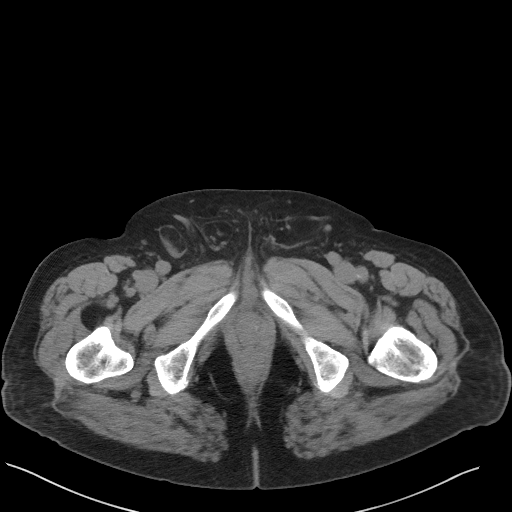
[im 18/87  soft-tissue]
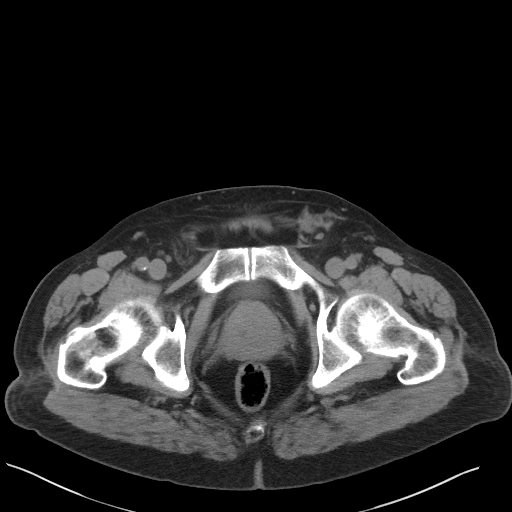
[im 26/87  soft-tissue]
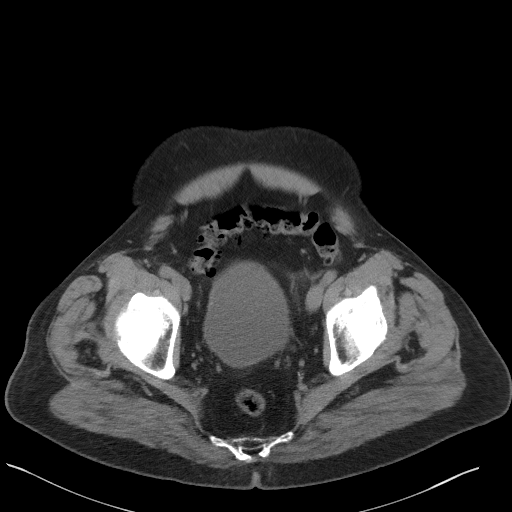
[im 33/87  soft-tissue]
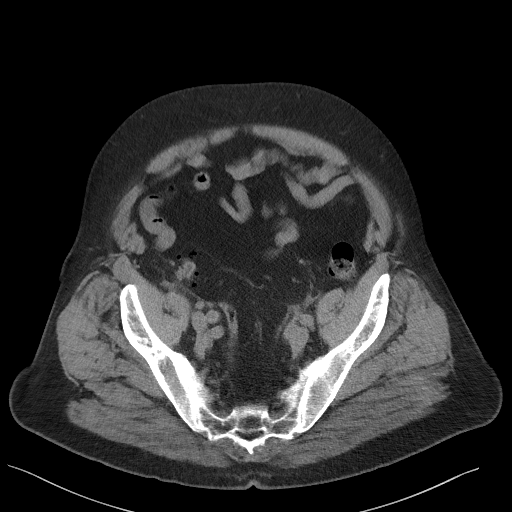
[im 40/87  soft-tissue]
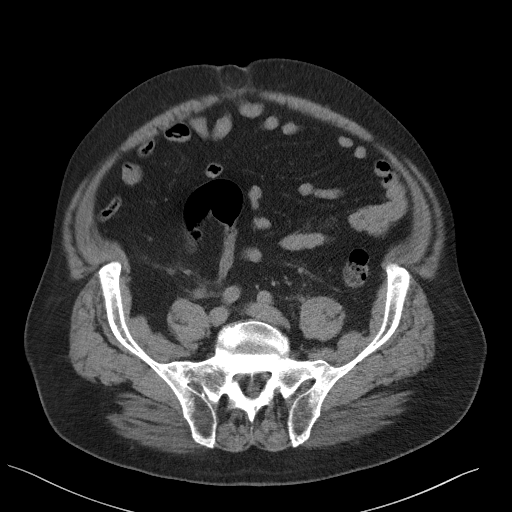
[im 47/87  soft-tissue]
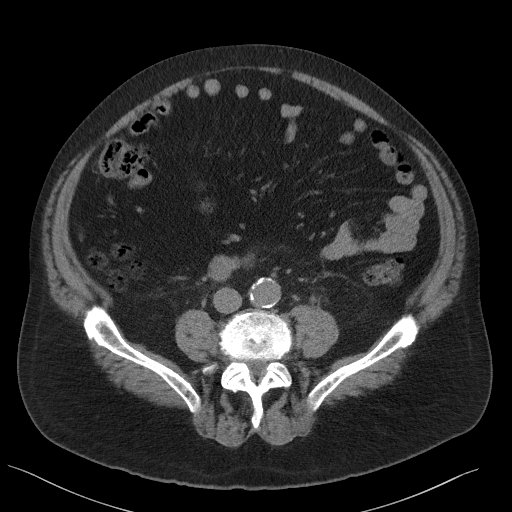
[im 54/87  soft-tissue]
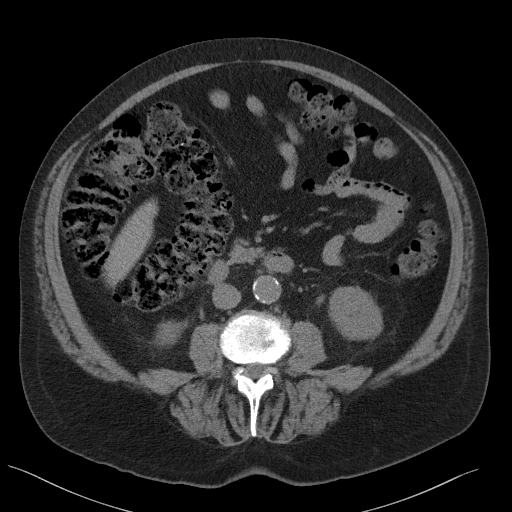
[im 61/87  soft-tissue]
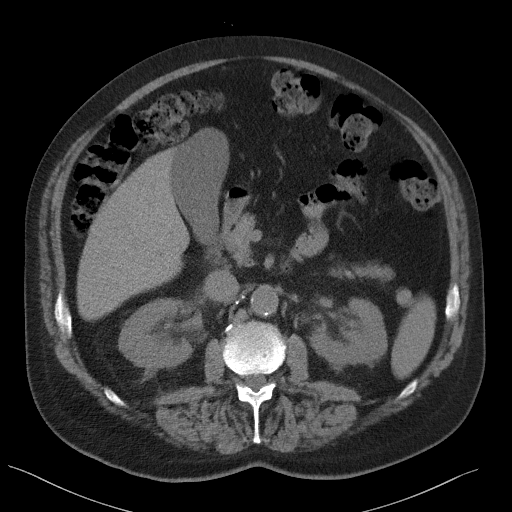
[im 61/87  bone]
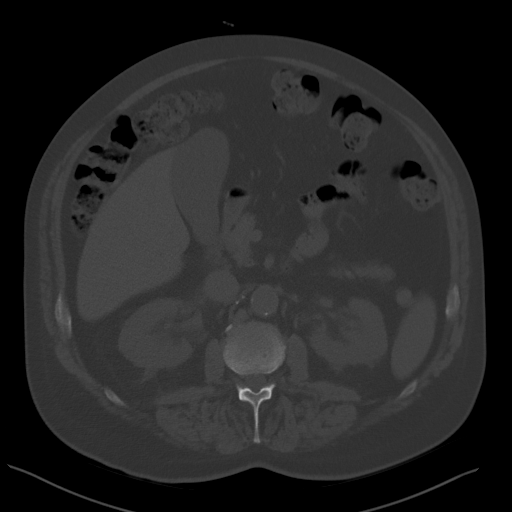
[im 69/87  soft-tissue]
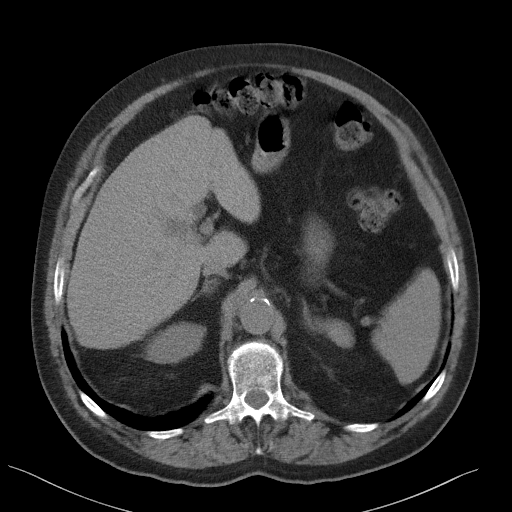
[im 76/87  soft-tissue]
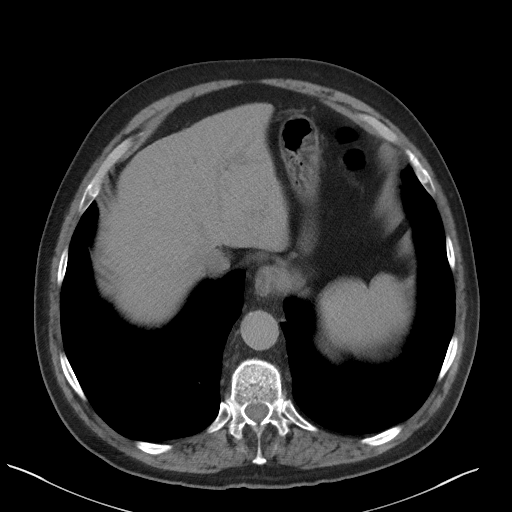
[im 83/87  soft-tissue]
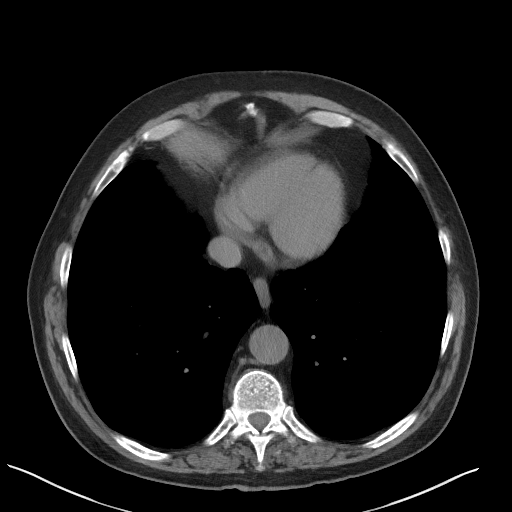

[Series 5: coronal st · coronal · 0.75mm/px · 3 of 119 slices shown]
[im 40/119  soft-tissue]
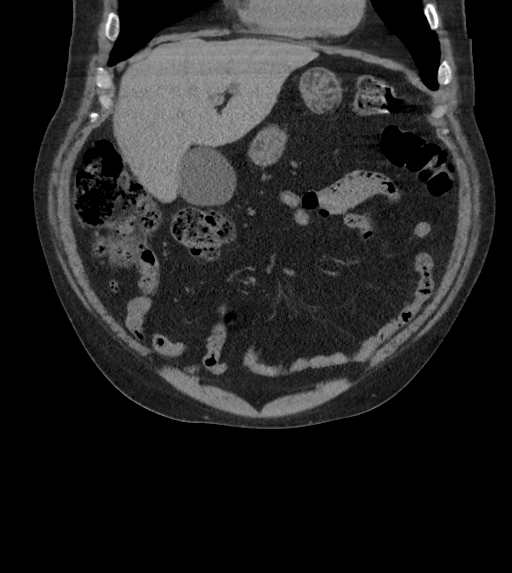
[im 53/119  soft-tissue]
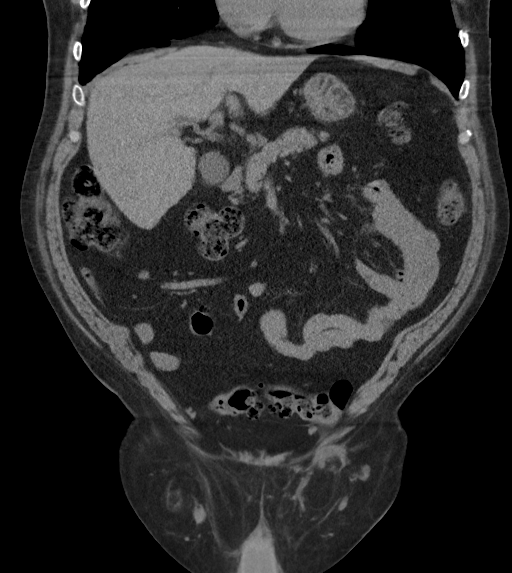
[im 66/119  soft-tissue]
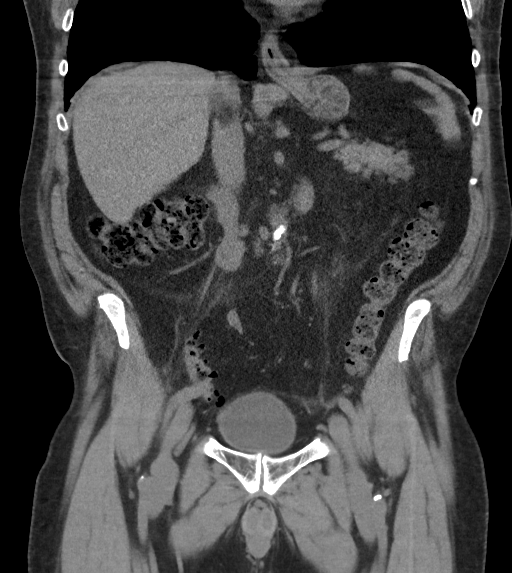

[15 of 46 positions shown; findings below may reference images not displayed]

FINDINGS: Lower chest: Emphysematous changes in the lung bases bilaterally.

Hepatobiliary: No definite cystic or solid hepatic lesions are noted
on today's noncontrast CT examination. Unenhanced appearance of the
gallbladder is normal.

Pancreas: No definite pancreatic mass or peripancreatic inflammatory
changes are noted on today's noncontrast CT examination.

Spleen: Unremarkable.

Adrenals/Urinary Tract: No calculi are identified within the
collecting system of either kidney, along the course of either
ureter, or within the lumen of the urinary bladder. There is no
hydroureteronephrosis. Mild bilateral perinephric stranding is noted
and is symmetric. Unenhanced appearance of the urinary bladder is
unremarkable. Bilateral adrenal glands are normal in appearance.

Stomach/Bowel: Unenhanced appearance of the stomach is normal. There
is no pathologic dilatation of small bowel or colon. Colonic
diverticulae are present. Adjacent to the mid sigmoid colon there
are some subtle inflammatory changes concerning for acute
diverticulitis, best appreciated on axial image 52 of series 2. No
diverticular abscess or signs of frank perforation are noted at this
time. Normal appendix.

Vascular/Lymphatic: Aortic atherosclerosis, without evidence of
aneurysm in the abdominal or pelvic vasculature. Retroaortic left
renal vein (normal anatomical variant) incidentally noted. No
lymphadenopathy noted in the abdomen or pelvis on today's
noncontrast CT examination.

Reproductive: Prostate gland and seminal vesicles are unremarkable
in appearance.

Other: Small left inguinal hernia containing a very short segment of
proximal sigmoid colon. Small umbilical hernia containing only
omental fat.

Musculoskeletal: There are no aggressive appearing lytic or blastic
lesions noted in the visualized portions of the skeleton.
IMPRESSION: 1. No urinary tract calculi.
2. Colonic diverticulosis with some subtle inflammatory changes
adjacent to the mid sigmoid colon, concerning for potential acute
diverticulitis. No definite findings to suggest diverticular abscess
or frank perforation at this time.
3. Nonspecific perinephric stranding bilaterally. This could
indicate urinary tract infection, or can be chronically seen in some
individuals. Clinical correlation is suggested.
4. Small left inguinal hernia containing a very short segment of the
proximal sigmoid colon, without evidence of bowel incarceration or
obstruction at this time.
5. Small umbilical hernia containing only omental fat.
6. Normal appendix.
7. Aortic atherosclerosis.

## 2018-06-07 ENCOUNTER — Emergency Department (HOSPITAL_COMMUNITY)
Admission: EM | Admit: 2018-06-07 | Discharge: 2018-06-08 | Disposition: A | Discharge status: Home / Self Care | Payer: Medicare Other | Attending: Emergency Medicine | Admitting: Emergency Medicine

## 2018-06-07 ENCOUNTER — Emergency Department (HOSPITAL_COMMUNITY): Payer: Medicare Other

## 2018-06-07 ENCOUNTER — Encounter (HOSPITAL_COMMUNITY): Payer: Self-pay | Admitting: Emergency Medicine

## 2018-06-07 ENCOUNTER — Other Ambulatory Visit: Payer: Self-pay

## 2018-06-07 DIAGNOSIS — Z79899 Other long term (current) drug therapy: Secondary | ICD-10-CM | POA: Diagnosis not present

## 2018-06-07 DIAGNOSIS — I10 Essential (primary) hypertension: Secondary | ICD-10-CM | POA: Insufficient documentation

## 2018-06-07 DIAGNOSIS — Z87891 Personal history of nicotine dependence: Secondary | ICD-10-CM | POA: Insufficient documentation

## 2018-06-07 DIAGNOSIS — J441 Chronic obstructive pulmonary disease with (acute) exacerbation: Secondary | ICD-10-CM | POA: Diagnosis not present

## 2018-06-07 DIAGNOSIS — R0602 Shortness of breath: Secondary | ICD-10-CM | POA: Diagnosis present

## 2018-06-07 LAB — CBC
HCT: 42.5 % (ref 39.0–52.0)
HEMOGLOBIN: 13.9 g/dL (ref 13.0–17.0)
MCH: 28.3 pg (ref 26.0–34.0)
MCHC: 32.7 g/dL (ref 30.0–36.0)
MCV: 86.4 fL (ref 78.0–100.0)
Platelets: 231 10*3/uL (ref 150–400)
RBC: 4.92 MIL/uL (ref 4.22–5.81)
RDW: 13.1 % (ref 11.5–15.5)
WBC: 10.6 10*3/uL — ABNORMAL HIGH (ref 4.0–10.5)

## 2018-06-07 LAB — BASIC METABOLIC PANEL
ANION GAP: 7 (ref 5–15)
BUN: 21 mg/dL (ref 8–23)
CALCIUM: 8.7 mg/dL — AB (ref 8.9–10.3)
CO2: 26 mmol/L (ref 22–32)
Chloride: 106 mmol/L (ref 98–111)
Creatinine, Ser: 1.09 mg/dL (ref 0.61–1.24)
GFR calc Af Amer: >60 mL/min (ref >60)
GFR calc non Af Amer: >60 mL/min (ref >60)
GLUCOSE: 108 mg/dL — AB (ref 70–99)
Potassium: 3.9 mmol/L (ref 3.5–5.1)
Sodium: 139 mmol/L (ref 135–145)

## 2018-06-07 MED ORDER — ALBUTEROL SULFATE (2.5 MG/3ML) 0.083% IN NEBU
5.0000 mg | INHALATION_SOLUTION | Freq: Once | RESPIRATORY_TRACT | Status: AC
  Administered 2018-06-07: 5 mg via RESPIRATORY_TRACT
  Filled 2018-06-07: qty 6

## 2018-06-07 MED ORDER — IPRATROPIUM-ALBUTEROL 0.5-2.5 (3) MG/3ML IN SOLN
3.0000 mL | Freq: Once | RESPIRATORY_TRACT | Status: AC
  Administered 2018-06-08: 3 mL via RESPIRATORY_TRACT
  Filled 2018-06-07: qty 3

## 2018-06-07 MED ORDER — PREDNISONE 50 MG PO TABS
60.0000 mg | ORAL_TABLET | Freq: Once | ORAL | Status: AC
  Administered 2018-06-07: 60 mg via ORAL
  Filled 2018-06-07: qty 1

## 2018-06-07 NOTE — ED Provider Notes (Signed)
[facility] EMERGENCY DEPARTMENT Provider Note   CSN: [id number] Arrival date & time: 06/07/18  2053     History   Chief Complaint Chief Complaint  Patient presents with  . Shortness of Breath    HPI Cole [name] is a 72 y.o. male.  The history is provided by the patient.  He has a history of hypertension, COPD, atrial fibrillation and comes in with shortness of breath that started tonight.  He woke up from a nap and noted that he was dyspneic.  He had felt fine before taking a nap.  He has a chronic cough productive of clear sputum and that has not changed.  He denies fever, chills, sweats.  Denies chest pain, heaviness, tightness, pressure.  He used his home nebulizer with some relief and his wife called for an ambulance.  He did receive another nebulizer treatment after arriving in the ED and states that he feels like he is back to his baseline.  Of note, he states that he quit smoking 12 years ago.  Past Medical History:  Diagnosis Date  . Atrial fibrillation (HCC)   . COPD (chronic obstructive pulmonary disease) (HCC)   . Hypertension     Patient Active Problem List   Diagnosis Date Noted  . Diverticulitis of colon 01/19/2017  . Atrial fibrillation with RVR (HCC) 12/28/2016  . CONSTIPATION 10/15/2008  . ELEVATED BLOOD PRESSURE WITHOUT DIAGNOSIS OF HYPERTENSION 05/28/2008  . CHEST PAIN 02/18/2008  . CHRONIC OBSTRUCTIVE PULMONARY DISEASE, ACUTE EXACERBATION 09/12/2007  . COPD (chronic obstructive pulmonary disease) (HCC) 09/12/2007    Past Surgical History:  Procedure Laterality Date  . COLONOSCOPY N/A 04/13/2017   Procedure: COLONOSCOPY;  Surgeon: Malissa Hippo, MD;  Location: [facility];  Service: Endoscopy;  Laterality: N/A;  12:00  . HERNIA REPAIR    . POLYPECTOMY  04/13/2017   Procedure: POLYPECTOMY;  Surgeon: Malissa Hippo, MD;  Location: [facility];  Service: Endoscopy;;  colon        Home Medications    Prior to Admission medications   Medication  Sig Start Date End Date Taking? Authorizing Provider  Cholecalciferol (VITAMIN D3) 5000 units CAPS Take 5,000 Units by mouth daily.    [provider]  diltiazem (CARTIA XT) 240 MG 24 hr capsule Take 1 capsule (240 mg total) by mouth daily. Patient taking differently: Take 240 mg by mouth daily before breakfast.  12/29/16   Oval Linsey, MD  Ipratropium-Albuterol (COMBIVENT RESPIMAT) 20-100 MCG/ACT AERS respimat Inhale 1 puff into the lungs every 6 (six) hours as needed for wheezing.    [provider]  ipratropium-albuterol (DUONEB) 0.5-2.5 (3) MG/3ML SOLN Take 3 mLs by nebulization every 4 (four) hours as needed (shortness of breath). For shortness of breath     [provider]  tamsulosin (FLOMAX) 0.4 MG CAPS capsule Take 0.4 mg by mouth daily.     [provider]    Family History Family History  Problem Relation Age of Onset  . Heart attack Mother 59  . Atrial fibrillation Brother     Social History Social History   Tobacco Use  . Smoking status: Former Smoker    Last attempt to quit: 09/18/2002    Years since quitting: 15.7  . Smokeless tobacco: Never Used  Substance Use Topics  . Alcohol use: No  . Drug use: No     Allergies   Patient has no known allergies.   Review of Systems Review of Systems  All other systems  reviewed and are negative.    Physical Exam Updated Vital Signs BP (!) 154/86 (BP Location: Left Arm)   Pulse 87   Temp 97.9 F (36.6 C) (Oral)   Resp (!) 24   Ht 5\' 10"  (1.778 m)   Wt 90.7 kg   SpO2 94%   BMI 28.70 kg/m   Physical Exam  Nursing note and vitals reviewed.  72 year old male, resting comfortably and in no acute distress. Vital signs are significant for elevated respiratory rate and systolic blood pressure. Oxygen saturation is 94%, which is normal. Head is normocephalic and atraumatic. PERRLA, EOMI. Oropharynx is clear. Neck is nontender and supple without adenopathy or JVD. Back is  nontender and there is no CVA tenderness. Lungs have diminished airflow throughout.  There are faint expiratory wheezes diffusely.  There are no rales or rhonchi. Chest is nontender. Heart has regular rate and rhythm without murmur. Abdomen is soft, flat, nontender without masses or hepatosplenomegaly and peristalsis is normoactive. Extremities have no cyanosis or edema, full range of motion is present. Skin is warm and dry without rash. Neurologic: Mental status is normal, cranial nerves are intact, there are no motor or sensory deficits.  ED Treatments / Results  Labs (all labs ordered are listed, but only abnormal results are displayed) Labs Reviewed  BASIC METABOLIC PANEL - Abnormal; Notable for the following components:      Result Value   Glucose, Bld 108 (*)    Calcium 8.7 (*)    All other components within normal limits  CBC - Abnormal; Notable for the following components:   WBC 10.6 (*)    All other components within normal limits    EKG EKG Interpretation  Date/Time:  Thursday June 07 2018 21:02:52 EDT Ventricular Rate:  89 PR Interval:    QRS Duration: 114 QT Interval:  383 QTC Calculation: 466 R Axis:   -17 Text Interpretation:  Sinus rhythm Borderline intraventricular conduction delay When compared with ECG of 12/28/2016, Sinus rhythm has replaced Atrial fibrillation with rapid ventricular response Confirmed by Dione Booze ([id number]) on 06/07/2018 10:56:24 PM   Radiology Dg Chest Portable 1 View  Result Date: 06/07/2018 CLINICAL DATA:  Short winded after walking today.  History of COPD. EXAM: PORTABLE CHEST 1 VIEW COMPARISON:  12/28/2016 FINDINGS: Slightly lower lung volumes than on prior. Heart size is top-normal. Mild aortic atherosclerosis without aneurysm. Mild central vascular congestion. No pulmonary consolidation or definite effusion. No acute osseous abnormality. IMPRESSION: Slightly lower lung volumes than on prior. Mild vascular congestion is noted. No  acute pulmonary consolidation. Electronically Signed   By: Tollie Eth M.D.   On: 06/07/2018 23:06    Procedures Procedures   Medications Ordered in ED Medications  albuterol (PROVENTIL) (2.5 MG/3ML) 0.083% nebulizer solution 5 mg (5 mg Nebulization Given 06/07/18 2107)  predniSONE (DELTASONE) tablet 60 mg (60 mg Oral Given 06/07/18 2327)  ipratropium-albuterol (DUONEB) 0.5-2.5 (3) MG/3ML nebulizer solution 3 mL (3 mLs Nebulization Given 06/08/18 0006)     Initial Impression / Assessment and Plan / ED Course  I have reviewed the triage vital signs and the nursing notes.  Pertinent labs & imaging results that were available during my care of the patient were reviewed by me and considered in my medical decision making (see chart for details).  COPD exacerbation.  Old records are reviewed, and he does have prior ED visits for COPD, but no hospital admissions for COPD.  Although patient states that he feels he is back to  his baseline, there is still some wheezing, so he will be given another albuterol with ipratropium nebulizer treatment and also given a dose of prednisone.  ECG showed no acute changes.  Labs show minimal leukocytosis.  Chest x-ray shows no acute process.  After second nebulizer treatment, there was still slight residual wheezing.  Patient states that he always has some wheezing.  He was felt to be safe for discharge.  He had normal respiratory rate without use of accessory muscles, normal heart rate and normal oxygen saturation.  He is discharged with prescription for prednisone.  Return precautions discussed.  Final Clinical Impressions(s) / ED Diagnoses   Final diagnoses:  COPD exacerbation [facility])    ED Discharge Orders         Ordered    predniSONE (DELTASONE) 50 MG tablet  Daily     06/08/18 0030           Dione Booze, MD 06/08/18 [phone]

## 2018-06-07 NOTE — ED Triage Notes (Signed)
Per ems pt was walking today and got "short winded" pt has hx of COPD. Pt used home 2.5 albuterol treatment and states it helped.

## 2018-06-08 MED ORDER — PREDNISONE 50 MG PO TABS: 50.0000 mg | ORAL_TABLET | Freq: Every day | ORAL | 0 refills | Status: DC

## 2018-06-08 NOTE — Discharge Instructions (Signed)
Continue using your home nebulizer and inhaler as needed. Return if symptoms are getting worse.

## 2019-05-17 ENCOUNTER — Other Ambulatory Visit: Payer: Self-pay

## 2019-05-17 ENCOUNTER — Ambulatory Visit (INDEPENDENT_AMBULATORY_CARE_PROVIDER_SITE_OTHER): Payer: Medicare Other | Admitting: Urology

## 2019-05-17 DIAGNOSIS — R972 Elevated prostate specific antigen [PSA]: Secondary | ICD-10-CM | POA: Diagnosis not present

## 2019-06-21 ENCOUNTER — Ambulatory Visit (INDEPENDENT_AMBULATORY_CARE_PROVIDER_SITE_OTHER): Payer: Medicare Other | Admitting: Urology

## 2019-06-21 ENCOUNTER — Other Ambulatory Visit: Payer: Self-pay

## 2019-06-21 DIAGNOSIS — N401 Enlarged prostate with lower urinary tract symptoms: Secondary | ICD-10-CM

## 2019-06-21 DIAGNOSIS — R3912 Poor urinary stream: Secondary | ICD-10-CM

## 2019-06-21 DIAGNOSIS — R972 Elevated prostate specific antigen [PSA]: Secondary | ICD-10-CM

## 2019-08-04 ENCOUNTER — Emergency Department (HOSPITAL_COMMUNITY): Payer: Medicare Other

## 2019-08-04 ENCOUNTER — Other Ambulatory Visit: Payer: Self-pay

## 2019-08-04 ENCOUNTER — Encounter (HOSPITAL_COMMUNITY): Payer: Self-pay | Admitting: Emergency Medicine

## 2019-08-04 ENCOUNTER — Emergency Department (HOSPITAL_COMMUNITY)
Admission: EM | Admit: 2019-08-04 | Discharge: 2019-08-04 | Disposition: A | Discharge status: Home / Self Care | Payer: Medicare Other | Attending: Emergency Medicine | Admitting: Emergency Medicine

## 2019-08-04 DIAGNOSIS — I10 Essential (primary) hypertension: Secondary | ICD-10-CM | POA: Insufficient documentation

## 2019-08-04 DIAGNOSIS — Z79899 Other long term (current) drug therapy: Secondary | ICD-10-CM | POA: Diagnosis not present

## 2019-08-04 DIAGNOSIS — R0602 Shortness of breath: Secondary | ICD-10-CM | POA: Diagnosis present

## 2019-08-04 DIAGNOSIS — I4891 Unspecified atrial fibrillation: Secondary | ICD-10-CM

## 2019-08-04 DIAGNOSIS — Z87891 Personal history of nicotine dependence: Secondary | ICD-10-CM | POA: Insufficient documentation

## 2019-08-04 DIAGNOSIS — J449 Chronic obstructive pulmonary disease, unspecified: Secondary | ICD-10-CM | POA: Diagnosis not present

## 2019-08-04 LAB — CBC WITH DIFFERENTIAL/PLATELET
Abs Immature Granulocytes: 0.06 10*3/uL (ref 0.00–0.07)
Basophils Absolute: 0 10*3/uL (ref 0.0–0.1)
Basophils Relative: 0 %
Eosinophils Absolute: 0.1 10*3/uL (ref 0.0–0.5)
Eosinophils Relative: 1 %
HCT: 46.4 % (ref 39.0–52.0)
Hemoglobin: 14.9 g/dL (ref 13.0–17.0)
Immature Granulocytes: 1 %
Lymphocytes Relative: 16 %
Lymphs Abs: 1.1 10*3/uL (ref 0.7–4.0)
MCH: 27.1 pg (ref 26.0–34.0)
MCHC: 32.1 g/dL (ref 30.0–36.0)
MCV: 84.5 fL (ref 80.0–100.0)
Monocytes Absolute: 0.6 10*3/uL (ref 0.1–1.0)
Monocytes Relative: 9 %
Neutro Abs: 5 10*3/uL (ref 1.7–7.7)
Neutrophils Relative %: 73 %
Platelets: 337 10*3/uL (ref 150–400)
RBC: 5.49 MIL/uL (ref 4.22–5.81)
RDW: 12.6 % (ref 11.5–15.5)
WBC: 7 10*3/uL (ref 4.0–10.5)
nRBC: 0 % (ref 0.0–0.2)

## 2019-08-04 LAB — BASIC METABOLIC PANEL
Anion gap: 8 (ref 5–15)
BUN: 11 mg/dL (ref 8–23)
CO2: 26 mmol/L (ref 22–32)
Calcium: 8.6 mg/dL — ABNORMAL LOW (ref 8.9–10.3)
Chloride: 105 mmol/L (ref 98–111)
Creatinine, Ser: 0.93 mg/dL (ref 0.61–1.24)
GFR calc Af Amer: >60 mL/min (ref >60)
GFR calc non Af Amer: >60 mL/min (ref >60)
Glucose, Bld: 113 mg/dL — ABNORMAL HIGH (ref 70–99)
Potassium: 3.8 mmol/L (ref 3.5–5.1)
Sodium: 139 mmol/L (ref 135–145)

## 2019-08-04 LAB — TROPONIN I (HIGH SENSITIVITY): Troponin I (High Sensitivity): 6 ng/L (ref <18)

## 2019-08-04 LAB — BRAIN NATRIURETIC PEPTIDE: B Natriuretic Peptide: 317 pg/mL — ABNORMAL HIGH (ref 0.0–100.0)

## 2019-08-04 MED ORDER — DILTIAZEM HCL 25 MG/5ML IV SOLN
25.0000 mg | Freq: Once | INTRAVENOUS | Status: AC
  Administered 2019-08-04: 09:00:00 25 mg via INTRAVENOUS
  Filled 2019-08-04: qty 5

## 2019-08-04 MED ORDER — DILTIAZEM HCL ER COATED BEADS 240 MG PO CP24
240.0000 mg | ORAL_CAPSULE | Freq: Every day | ORAL | Status: DC
  Administered 2019-08-04: 10:00:00 240 mg via ORAL
  Filled 2019-08-04 (×2): qty 1

## 2019-08-04 NOTE — ED Triage Notes (Addendum)
Patient brought in via EMS from home for shortness of breath and increased heart rate. Patient alert and oriented. Airway patent. Patient's O2 sat 90% on room air upon EMS arrival and patient in tripod position. Patient placed on 4L of oxygen via [place]-O2 sat 97%. Patient in a-fib in which he has a hx and missed dose of Cardizem this morning, original HR 170 upon EMS arrival. Patient given max of 25mg  of Cardizem in route and rate reduced to 145-150. Patient reports feeling short of breath with productive cough. Per patient thick clear sputum. Denies any chest pain or fevers.

## 2019-08-04 NOTE — ED Provider Notes (Signed)
[facility] EMERGENCY DEPARTMENT Provider Note   CSN: [id number] Arrival date & time: 08/04/19  8768     History   Chief Complaint Chief Complaint  Patient presents with  . Shortness of Breath    HPI Cole [name] is a 72 y.o. male with a history of chronic afib on diltiazem (no missed doses until this am), COPD and HTN  Presenting with shortness of breath and tachycardia.  He reports having increased sob for about a week, along with productive cough and clear sputum production without fever or chills, also denies chest pain.  Yesterday he started noticing tachycardia which was worsened this am when he woke.  Per ems, his initial rate was 170 which  Dropped to the 145-150 range after giving 25 mg of Cardizem.  He denies wheezing and denies chest pain, also no n/v, abdominal pain, dizziness or diaphoresis, no peripheral edema or extremity pain.  He states the last time he had an a fib problem was at least 5 years ago. He is not on anticoagulation therapy.      The history is provided by the patient.    Past Medical History:  Diagnosis Date  . Atrial fibrillation (HCC)   . COPD (chronic obstructive pulmonary disease) (HCC)   . Hypertension     Patient Active Problem List   Diagnosis Date Noted  . Diverticulitis of colon 01/19/2017  . Atrial fibrillation with RVR (HCC) 12/28/2016  . CONSTIPATION 10/15/2008  . ELEVATED BLOOD PRESSURE WITHOUT DIAGNOSIS OF HYPERTENSION 05/28/2008  . CHEST PAIN 02/18/2008  . CHRONIC OBSTRUCTIVE PULMONARY DISEASE, ACUTE EXACERBATION 09/12/2007  . COPD (chronic obstructive pulmonary disease) (HCC) 09/12/2007    Past Surgical History:  Procedure Laterality Date  . COLONOSCOPY N/A 04/13/2017   Procedure: COLONOSCOPY;  Surgeon: Malissa Hippo, MD;  Location: [facility];  Service: Endoscopy;  Laterality: N/A;  12:00  . HERNIA REPAIR    . POLYPECTOMY  04/13/2017   Procedure: POLYPECTOMY;  Surgeon: Malissa Hippo, MD;  Location: [facility];   Service: Endoscopy;;  colon        Home Medications    Prior to Admission medications   Medication Sig Start Date End Date Taking? Authorizing Provider  Cholecalciferol (VITAMIN D3) 5000 units CAPS Take 5,000 Units by mouth daily.   Yes [provider]  diltiazem (CARTIA XT) 240 MG 24 hr capsule Take 1 capsule (240 mg total) by mouth daily. Patient taking differently: Take 240 mg by mouth daily before breakfast.  12/29/16  Yes Dondiego, Richard, MD  Ipratropium-Albuterol (COMBIVENT RESPIMAT) 20-100 MCG/ACT AERS respimat Inhale 1 puff into the lungs every 6 (six) hours as needed for wheezing.   Yes [provider]  ipratropium-albuterol (DUONEB) 0.5-2.5 (3) MG/3ML SOLN Take 3 mLs by nebulization every 4 (four) hours as needed (shortness of breath). For shortness of breath    Yes [provider]  lisinopril (ZESTRIL) 20 MG tablet Take 1 tablet by mouth daily. 03/06/19  Yes [provider]  tamsulosin (FLOMAX) 0.4 MG CAPS capsule Take 0.4 mg by mouth daily.    Yes [provider]  predniSONE (DELTASONE) 50 MG tablet Take 1 tablet (50 mg total) by mouth daily. Patient not taking: Reported on 08/04/2019 06/08/18   Dione Booze, MD    Family History Family History  Problem Relation Age of Onset  . Heart attack Mother 28  . Atrial fibrillation Brother     Social History Social History   Tobacco Use  . Smoking status:  Former Smoker    Quit date: 09/18/2002    Years since quitting: 16.8  . Smokeless tobacco: Never Used  Substance Use Topics  . Alcohol use: No  . Drug use: No     Allergies   Patient has no known allergies.   Review of Systems Review of Systems  Constitutional: Negative for chills and fever.  HENT: Negative for congestion and sore throat.   Eyes: Negative.   Respiratory: Positive for cough and shortness of breath. Negative for chest tightness and wheezing.   Cardiovascular: Positive for palpitations. Negative for chest  pain.  Gastrointestinal: Negative for abdominal pain, nausea and vomiting.  Genitourinary: Negative.   Musculoskeletal: Negative for arthralgias, joint swelling and neck pain.  Skin: Negative.  Negative for rash and wound.  Neurological: Negative for dizziness, weakness, light-headedness, numbness and headaches.  Psychiatric/Behavioral: Negative.      Physical Exam Updated Vital Signs BP 136/80   Pulse 66   Temp 97.8 F (36.6 C) (Oral)   Resp (!) 22   Ht 5\' 10"  (1.778 m)   Wt 90.7 kg   SpO2 97%   BMI 28.70 kg/m   Physical Exam Vitals signs and nursing note reviewed.  Constitutional:      General: He is not in acute distress.    Appearance: He is well-developed.  HENT:     Head: Normocephalic and atraumatic.     Mouth/Throat:     Mouth: Mucous membranes are moist.  Eyes:     Conjunctiva/sclera: Conjunctivae normal.  Neck:     Musculoskeletal: Normal range of motion.  Cardiovascular:     Rate and Rhythm: Tachycardia present. Rhythm irregular.  Pulmonary:     Effort: Pulmonary effort is normal.     Breath sounds: Decreased breath sounds present. No wheezing, rhonchi or rales.     Comments: Modest distant breath sounds.  Rub appreciated right base.  No wheezing. Abdominal:     General: Bowel sounds are normal.     Palpations: Abdomen is soft.     Tenderness: There is no abdominal tenderness.  Musculoskeletal: Normal range of motion.  Skin:    General: Skin is warm and dry.  Neurological:     Mental Status: He is alert.      ED Treatments / Results  Labs (all labs ordered are listed, but only abnormal results are displayed) Labs Reviewed  BASIC METABOLIC PANEL - Abnormal; Notable for the following components:      Result Value   Glucose, Bld 113 (*)    Calcium 8.6 (*)    All other components within normal limits  BRAIN NATRIURETIC PEPTIDE - Abnormal; Notable for the following components:   B Natriuretic Peptide 317.0 (*)    All other components within  normal limits  CBC WITH DIFFERENTIAL/PLATELET  TROPONIN I (HIGH SENSITIVITY)    EKG EKG Interpretation  Date/Time:  Sunday August 04 2019 09:20:08 EST Ventricular Rate:  113 PR Interval:    QRS Duration: 98 QT Interval:  334 QTC Calculation: 458 R Axis:   -7 Text Interpretation: Atrial fibrillation Ventricular premature complex Nonspecific T abnormalities, lateral leads a fib new since last tracing Confirmed by Linwood DibblesKnapp, Jon [phone]([id number]) on 08/04/2019 9:22:02 AM   Radiology Dg Chest Port 1 View  Result Date: 08/04/2019 CLINICAL DATA:  Shortness of breath and tachycardia. Productive cough. EXAM: PORTABLE CHEST 1 VIEW COMPARISON:  06/07/2018 FINDINGS: The cardiopericardial silhouette is within normal limits for size. There is pulmonary vascular congestion without overt pulmonary edema. Lungs are  hyperexpanded bilaterally. No pleural effusion. The visualized bony structures of the thorax are intact. Telemetry leads overlie the chest. IMPRESSION: 1. Pulmonary vascular congestion without overt edema or focal pneumonia. 2. Underlying emphysema. Electronically Signed   By: Misty Stanley M.D.   On: 08/04/2019 10:02    Procedures Procedures (including critical care time)  Medications Ordered in ED Medications  diltiazem (CARDIZEM CD) 24 hr capsule 240 mg (240 mg Oral Given 08/04/19 1007)  diltiazem (CARDIZEM) injection 25 mg (25 mg Intravenous Given 08/04/19 0914)     Initial Impression / Assessment and Plan / ED Course  I have reviewed the triage vital signs and the nursing notes.  Pertinent labs & imaging results that were available during my care of the patient were reviewed by me and considered in my medical decision making (see chart for details).        Pt with afib with rvr, responded partially to cardizem 25 mg bolus prior to arrival.  Added additional 25 mg dose here.  Rate in the 115-125.  Oral dose 240 mg long acting to replace missed dose from this am.   11:10 AM Pt  recheck with pulse rate mid 60's, no sob or any complaints at this time including Ct, palpitations.  Review of chart states he had new onset afib in 2018, eval by Dr. [name] as inpatient, recommended Jennye Moccasin which he is not currently taking.  He does not know why, does not remember ever taking this.  He does have a f/u appt with his pcp tomorrow. Advised to discuss this medicine with him as he would benefit from this with an increased Chads score of 3.  Also advised to discuss with pcp whether his cardizem should be adjusted given breakthough sx.  Return precautions discussed.  Oxygen discontinued, oxygen remained 97%.   Final Clinical Impressions(s) / ED Diagnoses   Final diagnoses:  Atrial fibrillation, unspecified type [facility])    ED Discharge Orders    None       Landis Martins 08/04/19 1234    Dorie Rank, MD 08/04/19 [phone]

## 2019-08-04 NOTE — Discharge Instructions (Addendum)
Your lab tests, ekg and chest xray are reassuring and your atrial fibrillation seems to have responded appropriately to the medicines given here.  See Dr. Cindie Laroche tomorrow as you have scheduled.  Ask him about whether you should still be on the medicine called Xarelto which will help prevent a blood clot which can occur with atrial fibrillation.  Also if you continue to have breakthrough episodes of your atrial fibrillation, you may need to have your cardizem adjusted - Dr. Cindie Laroche can guide you further with this. In the interim, if your palpitations return today come back here for further treatment.

## 2019-08-04 NOTE — ED Provider Notes (Signed)
Medical screening examination/treatment/procedure(s) were conducted as a shared visit with non-physician practitioner(s) and myself.  I personally evaluated the patient during the encounter.  EKG Interpretation  Date/Time:  Sunday August 04 2019 09:20:08 EST Ventricular Rate:  113 PR Interval:    QRS Duration: 98 QT Interval:  334 QTC Calculation: 458 R Axis:   -7 Text Interpretation: Atrial fibrillation Ventricular premature complex Nonspecific T abnormalities, lateral leads a fib new since last tracing Confirmed by Dorie Rank [phone]) on 08/04/2019 9:22:02 AM  9:46 AM Pt examined.  Feeling much better now after treatment with cardizem for his rapid a fib.   Lungs are CTA.  Heart rate low 100s.  Will continue to monitor, check labs and xray.  Suspect his dyspnea was related to his a fib rvr.  Pt indicates he is not on anticoagulation.  Chads2 Vasc is 3 which would indicate he should be on anticoagulation.   Will need to have him follow up with PCP/cardiologist to address this   Dorie Rank, MD 08/04/19 1513

## 2019-09-18 ENCOUNTER — Other Ambulatory Visit: Payer: Self-pay

## 2019-09-18 DIAGNOSIS — R972 Elevated prostate specific antigen [PSA]: Secondary | ICD-10-CM

## 2020-01-26 ENCOUNTER — Inpatient Hospital Stay (HOSPITAL_COMMUNITY)
Admission: EM | Admit: 2020-01-26 | Discharge: 2020-01-28 | DRG: 190 | Disposition: A | Discharge status: Home / Self Care | Payer: Medicare PPO | Attending: Family Medicine | Admitting: Family Medicine

## 2020-01-26 ENCOUNTER — Encounter (HOSPITAL_COMMUNITY): Payer: Self-pay

## 2020-01-26 ENCOUNTER — Other Ambulatory Visit: Payer: Self-pay

## 2020-01-26 ENCOUNTER — Emergency Department (HOSPITAL_COMMUNITY): Payer: Medicare PPO

## 2020-01-26 DIAGNOSIS — I10 Essential (primary) hypertension: Secondary | ICD-10-CM | POA: Diagnosis present

## 2020-01-26 DIAGNOSIS — Z8249 Family history of ischemic heart disease and other diseases of the circulatory system: Secondary | ICD-10-CM

## 2020-01-26 DIAGNOSIS — Z20822 Contact with and (suspected) exposure to covid-19: Secondary | ICD-10-CM | POA: Diagnosis present

## 2020-01-26 DIAGNOSIS — J441 Chronic obstructive pulmonary disease with (acute) exacerbation: Principal | ICD-10-CM | POA: Diagnosis present

## 2020-01-26 DIAGNOSIS — R42 Dizziness and giddiness: Secondary | ICD-10-CM | POA: Diagnosis present

## 2020-01-26 DIAGNOSIS — I482 Chronic atrial fibrillation, unspecified: Secondary | ICD-10-CM | POA: Diagnosis present

## 2020-01-26 DIAGNOSIS — N400 Enlarged prostate without lower urinary tract symptoms: Secondary | ICD-10-CM | POA: Diagnosis present

## 2020-01-26 DIAGNOSIS — Z79899 Other long term (current) drug therapy: Secondary | ICD-10-CM

## 2020-01-26 DIAGNOSIS — R269 Unspecified abnormalities of gait and mobility: Secondary | ICD-10-CM | POA: Diagnosis present

## 2020-01-26 DIAGNOSIS — R0600 Dyspnea, unspecified: Secondary | ICD-10-CM | POA: Diagnosis not present

## 2020-01-26 DIAGNOSIS — E871 Hypo-osmolality and hyponatremia: Secondary | ICD-10-CM | POA: Diagnosis present

## 2020-01-26 DIAGNOSIS — Z87891 Personal history of nicotine dependence: Secondary | ICD-10-CM | POA: Diagnosis not present

## 2020-01-26 DIAGNOSIS — J9601 Acute respiratory failure with hypoxia: Secondary | ICD-10-CM | POA: Diagnosis present

## 2020-01-26 DIAGNOSIS — I4891 Unspecified atrial fibrillation: Secondary | ICD-10-CM | POA: Diagnosis present

## 2020-01-26 HISTORY — DX: Benign prostatic hyperplasia without lower urinary tract symptoms: N40.0

## 2020-01-26 LAB — CBC WITH DIFFERENTIAL/PLATELET
Abs Immature Granulocytes: 0.02 10*3/uL (ref 0.00–0.07)
Basophils Absolute: 0.1 10*3/uL (ref 0.0–0.1)
Basophils Relative: 1 %
Eosinophils Absolute: 0.2 10*3/uL (ref 0.0–0.5)
Eosinophils Relative: 2 %
HCT: 44.9 % (ref 39.0–52.0)
Hemoglobin: 14.5 g/dL (ref 13.0–17.0)
Immature Granulocytes: 0 %
Lymphocytes Relative: 20 %
Lymphs Abs: 1.7 10*3/uL (ref 0.7–4.0)
MCH: 27.8 pg (ref 26.0–34.0)
MCHC: 32.3 g/dL (ref 30.0–36.0)
MCV: 86.2 fL (ref 80.0–100.0)
Monocytes Absolute: 0.9 10*3/uL (ref 0.1–1.0)
Monocytes Relative: 11 %
Neutro Abs: 5.8 10*3/uL (ref 1.7–7.7)
Neutrophils Relative %: 66 %
Platelets: 210 10*3/uL (ref 150–400)
RBC: 5.21 MIL/uL (ref 4.22–5.81)
RDW: 13 % (ref 11.5–15.5)
WBC: 8.7 10*3/uL (ref 4.0–10.5)
nRBC: 0 % (ref 0.0–0.2)

## 2020-01-26 LAB — BASIC METABOLIC PANEL
Anion gap: 10 (ref 5–15)
BUN: 14 mg/dL (ref 8–23)
CO2: 24 mmol/L (ref 22–32)
Calcium: 8.4 mg/dL — ABNORMAL LOW (ref 8.9–10.3)
Chloride: 100 mmol/L (ref 98–111)
Creatinine, Ser: 1.25 mg/dL — ABNORMAL HIGH (ref 0.61–1.24)
GFR calc Af Amer: >60 mL/min (ref >60)
GFR calc non Af Amer: 56 mL/min — ABNORMAL LOW (ref >60)
Glucose, Bld: 105 mg/dL — ABNORMAL HIGH (ref 70–99)
Potassium: 4.1 mmol/L (ref 3.5–5.1)
Sodium: 134 mmol/L — ABNORMAL LOW (ref 135–145)

## 2020-01-26 LAB — D-DIMER, QUANTITATIVE: D-Dimer, Quant: 0.46 ug/mL-FEU (ref 0.00–0.50)

## 2020-01-26 LAB — SARS CORONAVIRUS 2 BY RT PCR (HOSPITAL ORDER, PERFORMED IN ~~LOC~~ HOSPITAL LAB): SARS Coronavirus 2: NEGATIVE

## 2020-01-26 LAB — TROPONIN I (HIGH SENSITIVITY)
Troponin I (High Sensitivity): 6 ng/L (ref <18)
Troponin I (High Sensitivity): 5 ng/L (ref <18)

## 2020-01-26 LAB — BRAIN NATRIURETIC PEPTIDE: B Natriuretic Peptide: 119 pg/mL — ABNORMAL HIGH (ref 0.0–100.0)

## 2020-01-26 MED ORDER — ENOXAPARIN SODIUM 40 MG/0.4ML ~~LOC~~ SOLN
40.0000 mg | SUBCUTANEOUS | Status: DC
  Administered 2020-01-26: 40 mg via SUBCUTANEOUS
  Filled 2020-01-26 (×2): qty 0.4

## 2020-01-26 MED ORDER — IPRATROPIUM-ALBUTEROL 0.5-2.5 (3) MG/3ML IN SOLN
3.0000 mL | Freq: Four times a day (QID) | RESPIRATORY_TRACT | Status: AC
  Administered 2020-01-26 – 2020-01-27 (×4): 3 mL via RESPIRATORY_TRACT
  Filled 2020-01-26 (×3): qty 3

## 2020-01-26 MED ORDER — IPRATROPIUM BROMIDE 0.02 % IN SOLN
RESPIRATORY_TRACT | Status: AC
  Filled 2020-01-26: qty 2.5

## 2020-01-26 MED ORDER — ACETAMINOPHEN 650 MG RE SUPP: 650.0000 mg | Freq: Four times a day (QID) | RECTAL | Status: DC | PRN

## 2020-01-26 MED ORDER — ASPIRIN EC 81 MG PO TBEC
81.0000 mg | DELAYED_RELEASE_TABLET | Freq: Every day | ORAL | Status: DC
  Administered 2020-01-26 – 2020-01-28 (×3): 81 mg via ORAL
  Filled 2020-01-26 (×3): qty 1

## 2020-01-26 MED ORDER — GUAIFENESIN-DM 100-10 MG/5ML PO SYRP: 5.0000 mL | ORAL_SOLUTION | ORAL | Status: DC | PRN

## 2020-01-26 MED ORDER — METHYLPREDNISOLONE SODIUM SUCC 125 MG IJ SOLR
60.0000 mg | Freq: Two times a day (BID) | INTRAMUSCULAR | Status: DC
  Administered 2020-01-27: 60 mg via INTRAVENOUS
  Filled 2020-01-26: qty 2

## 2020-01-26 MED ORDER — DILTIAZEM HCL ER COATED BEADS 240 MG PO CP24
240.0000 mg | ORAL_CAPSULE | Freq: Every day | ORAL | Status: DC
  Administered 2020-01-27 – 2020-01-28 (×2): 240 mg via ORAL
  Filled 2020-01-26 (×3): qty 1

## 2020-01-26 MED ORDER — SODIUM CHLORIDE 0.9 % IV SOLN
500.0000 mg | INTRAVENOUS | Status: DC
  Administered 2020-01-26 – 2020-01-27 (×2): 500 mg via INTRAVENOUS
  Filled 2020-01-26 (×3): qty 500

## 2020-01-26 MED ORDER — IPRATROPIUM-ALBUTEROL 0.5-2.5 (3) MG/3ML IN SOLN
3.0000 mL | Freq: Once | RESPIRATORY_TRACT | Status: AC
  Administered 2020-01-26: 3 mL via RESPIRATORY_TRACT
  Filled 2020-01-26: qty 3

## 2020-01-26 MED ORDER — METHYLPREDNISOLONE SODIUM SUCC 125 MG IJ SOLR
125.0000 mg | Freq: Once | INTRAMUSCULAR | Status: AC
  Administered 2020-01-26: 125 mg via INTRAVENOUS
  Filled 2020-01-26: qty 2

## 2020-01-26 MED ORDER — ALBUTEROL SULFATE HFA 108 (90 BASE) MCG/ACT IN AERS
4.0000 | INHALATION_SPRAY | Freq: Once | RESPIRATORY_TRACT | Status: AC
  Administered 2020-01-26: 4 via RESPIRATORY_TRACT
  Filled 2020-01-26: qty 6.7

## 2020-01-26 MED ORDER — TAMSULOSIN HCL 0.4 MG PO CAPS
0.4000 mg | ORAL_CAPSULE | Freq: Every day | ORAL | Status: DC
  Administered 2020-01-27 – 2020-01-28 (×2): 0.4 mg via ORAL
  Filled 2020-01-26 (×2): qty 1

## 2020-01-26 MED ORDER — ACETAMINOPHEN 325 MG PO TABS: 650.0000 mg | ORAL_TABLET | Freq: Four times a day (QID) | ORAL | Status: DC | PRN

## 2020-01-26 MED ORDER — ONDANSETRON HCL 4 MG/2ML IJ SOLN: 4.0000 mg | Freq: Four times a day (QID) | INTRAMUSCULAR | Status: DC | PRN

## 2020-01-26 MED ORDER — IPRATROPIUM-ALBUTEROL 0.5-2.5 (3) MG/3ML IN SOLN
3.0000 mL | RESPIRATORY_TRACT | Status: DC | PRN
  Filled 2020-01-26: qty 3

## 2020-01-26 MED ORDER — ONDANSETRON HCL 4 MG PO TABS: 4.0000 mg | ORAL_TABLET | Freq: Four times a day (QID) | ORAL | Status: DC | PRN

## 2020-01-26 NOTE — ED Triage Notes (Signed)
Pt brought in by EMS due to dizziness since yesterday. Pt also reports SOB since yesterday sats dropped to 83 % while en route and 2 L via [place] initiated . Pt has used duoneb 2 this am. Denies pain and cough

## 2020-01-26 NOTE — ED Provider Notes (Addendum)
Patient with a history of COPD.  Not normally on oxygen.  But requiring oxygen here his oxygen sats were below 90% they were actually 83% on room air.  On the 2 L he is in the upper 90s.  Still somewhat tachypneic.  But more comfortable on the oxygen.  Chest x-ray not very impressive no leukocytosis D-dimer is actually normal.  At 0.46.  So that rules out concerns for pulmonary embolus.  Patient requires oxygen and we can get him cleared he will require admission.  Patient also did give a history of some bilateral leg swelling.  But his shortness of breath really started yesterday.  Patient does have nebulizer treatments available at home.  Patient not currently on oral steroids.  But he is on Combivent.  Patient has a history of atrial fibrillation.  His EKG here did not show that.  But cardiac monitoring is suggestive of atrial for but it is rate controlled.  Record reviewed seems to imply patient not on any blood thinner.  Will somewhat evaluate to see if there is a reason for that.  If not he may need to be started on 1 however have a referral to cardiology for follow-up for back to his primary care doctor.  Medical screening examination/treatment/procedure(s) were conducted as a shared visit with non-physician practitioner(s) and myself.  I personally evaluated the patient during the encounter.  EKG Interpretation  Date/Time:  Sunday Jan 26 2020 14:09:56 EDT Ventricular Rate:  73 PR Interval:    QRS Duration: 109 QT Interval:  405 QTC Calculation: 447 R Axis:   1 Text Interpretation: Sinus rhythm Nonspecific T abnormalities, lateral leads Baseline wander in lead(s) V2 Confirmed by Vanetta Mulders [phone]) on 01/26/2020 2:17:59 PM   Patient seen by me along with physician assistant.   Vanetta Mulders, MD 01/26/20 1457    Vanetta Mulders, MD 01/26/20 1459

## 2020-01-26 NOTE — ED Notes (Signed)
D/c 2L O2 via [place], pt states that he feels ok and would like to go home, pt 91% on room air, pt ambulatory in place and desatted to 89, pt states that he still feels ok, pt ambulatory to bathroom and back pt has increased RR 28 and O2 sat 87-88% on room air, pt states that he feels ok.

## 2020-01-26 NOTE — ED Provider Notes (Signed)
[facility] EMERGENCY DEPARTMENT Provider Note   CSN: [id number] Arrival date & time: 01/26/20  1402     History Chief Complaint  Patient presents with  . Dizziness    Lightheadedness  . Shortness of Breath    Cole [name] is a 74 y.o. male.  Cole [name] is a 74 y.o. male with a history of COPD, hypertension, A. fib and BPH, who presents to the ED for evaluation of lightheadedness and shortness of breath.  He reports that yesterday morning he started feeling more short of breath and feeling lightheaded intermittently.  He denies passing out.  Reports today his shortness of breath is continued to worsen despite using his inhalers at home.  He has had an occasional cough which she reports feels like his chronic cough he is not coughing anything up and denies fevers or chills.  Denies associated chest pain.  Does report that over the past 2 months he has had some swelling in his left ankle, his PCP told him to start wearing a compression stocking and elevating the leg, he reports this has not totally resolved, he denies calf pain, redness or warmth.  No history of DVT or PE.  Patient does have a history of A. fib, has not felt palpitations or heart racing.  He is not on anticoagulation but has been taking his diltiazem regularly.  History of COPD but no home oxygen requirement, when EMS arrived he was found to be hypoxic at 83%, placed on 2 L nasal cannula with improvement.  No other aggravating or alleviating factors.        Past Medical History:  Diagnosis Date  . Atrial fibrillation (HCC)   . BPH (benign prostatic hyperplasia)   . COPD (chronic obstructive pulmonary disease) (HCC)   . Hypertension     Patient Active Problem List   Diagnosis Date Noted  . Diverticulitis of colon 01/19/2017  . Atrial fibrillation with RVR (HCC) 12/28/2016  . CONSTIPATION 10/15/2008  . ELEVATED BLOOD PRESSURE WITHOUT DIAGNOSIS OF HYPERTENSION 05/28/2008  . CHEST PAIN 02/18/2008  . CHRONIC OBSTRUCTIVE  PULMONARY DISEASE, ACUTE EXACERBATION 09/12/2007  . COPD (chronic obstructive pulmonary disease) (HCC) 09/12/2007    Past Surgical History:  Procedure Laterality Date  . COLONOSCOPY N/A 04/13/2017   Procedure: COLONOSCOPY;  Surgeon: Malissa Hippo, MD;  [facility];  Service: Endoscopy;  Laterality: N/A;  12:00  . HERNIA REPAIR    . POLYPECTOMY  04/13/2017   Procedure: POLYPECTOMY;  Surgeon: Malissa Hippo, MD;  Location: [facility];  Service: Endoscopy;;  colon       Family History  Problem Relation Age of Onset  . Heart attack Mother 21  . Atrial fibrillation Brother     Social History   Tobacco Use  . Smoking status: Former Smoker    Quit date: 09/18/2002    Years since quitting: 17.3  . Smokeless tobacco: Never Used  Substance Use Topics  . Alcohol use: No  . Drug use: No    Home Medications Prior to Admission medications   Medication Sig Start Date End Date Taking? Authorizing Provider  Cholecalciferol (VITAMIN D3) 5000 units CAPS Take 5,000 Units by mouth daily.    [provider]  diltiazem (CARTIA XT) 240 MG 24 hr capsule Take 1 capsule (240 mg total) by mouth daily. Patient taking differently: Take 240 mg by mouth daily before breakfast.  12/29/16   Oval Linsey, MD  Ipratropium-Albuterol (COMBIVENT RESPIMAT) 20-100 MCG/ACT AERS respimat Inhale 1 puff  into the lungs every 6 (six) hours as needed for wheezing.    [provider]  ipratropium-albuterol (DUONEB) 0.5-2.5 (3) MG/3ML SOLN Take 3 mLs by nebulization every 4 (four) hours as needed (shortness of breath). For shortness of breath     [provider]  lisinopril (ZESTRIL) 20 MG tablet Take 1 tablet by mouth daily. 03/06/19   [provider]  predniSONE (DELTASONE) 50 MG tablet Take 1 tablet (50 mg total) by mouth daily. Patient not taking: Reported on 08/04/2019 06/08/18   Dione Booze, MD  tamsulosin (FLOMAX) 0.4 MG CAPS capsule Take 0.4 mg by mouth daily.      [provider]    Allergies    Patient has no known allergies.  Review of Systems   Review of Systems  Constitutional: Negative for chills and fever.  HENT: Negative.   Respiratory: Positive for cough and shortness of breath.   Cardiovascular: Positive for leg swelling. Negative for chest pain and palpitations.  Gastrointestinal: Negative for abdominal pain, nausea and vomiting.  Genitourinary: Negative for dysuria and frequency.  Musculoskeletal: Negative for arthralgias and myalgias.  Skin: Negative for color change and rash.  Neurological: Positive for light-headedness. Negative for dizziness, syncope, weakness, numbness and headaches.  All other systems reviewed and are negative.   Physical Exam Updated Vital Signs BP (!) 143/77 (BP Location: Right Arm)   Pulse 83   Temp 98.1 F (36.7 C) (Oral)   Resp (!) 22   Ht 5\' 6"  (1.676 m)   Wt 88.5 kg   SpO2 93%   BMI 31.47 kg/m   Physical Exam Vitals and nursing note reviewed.  Constitutional:      General: He is not in acute distress.    Appearance: He is well-developed. He is ill-appearing. He is not diaphoretic.     Comments: Somewhat ill-appearing but in no acute distress  HENT:     Head: Normocephalic and atraumatic.  Eyes:     General:        Right eye: No discharge.        Left eye: No discharge.  Cardiovascular:     Rate and Rhythm: Normal rate and regular rhythm.     Pulses: Normal pulses.     Heart sounds: Normal heart sounds. No murmur. No friction rub. No gallop.   Pulmonary:     Effort: No respiratory distress.     Breath sounds: Wheezing present. No rales.     Comments: Patient tachypneic with some increased respiratory effort, requiring 2 L nasal cannula, on exam he has decreased air movement with some wheezing and prolonged expiratory phase noted bilaterally Abdominal:     General: Bowel sounds are normal. There is no distension.     Palpations: Abdomen is soft. There is no mass.      Tenderness: There is no abdominal tenderness. There is no guarding.     Comments: Abdomen soft, nondistended, nontender to palpation in all quadrants without guarding or peritoneal signs  Musculoskeletal:        General: No deformity.     Cervical back: Neck supple.     Comments: Trace edema of the left ankle, NTTP  Skin:    General: Skin is warm and dry.     Capillary Refill: Capillary refill takes less than 2 seconds.  Neurological:     Mental Status: He is alert.     Coordination: Coordination normal.     Comments: Speech is clear, able to follow commands Moves extremities  without ataxia, coordination intact  Psychiatric:        Mood and Affect: Mood normal.        Behavior: Behavior normal.     ED Results / Procedures / Treatments   Labs (all labs ordered are listed, but only abnormal results are displayed) Labs Reviewed  BASIC METABOLIC PANEL - Abnormal; Notable for the following components:      Result Value   Sodium 134 (*)    Glucose, Bld 105 (*)    Creatinine, Ser 1.25 (*)    Calcium 8.4 (*)    GFR calc non Af Amer 56 (*)    All other components within normal limits  BRAIN NATRIURETIC PEPTIDE - Abnormal; Notable for the following components:   B Natriuretic Peptide 119.0 (*)    All other components within normal limits  SARS CORONAVIRUS 2 BY RT PCR (HOSPITAL ORDER, [facility] [place] [facility])  CBC WITH DIFFERENTIAL/PLATELET  D-DIMER, QUANTITATIVE (NOT AT [facility])  TROPONIN I (HIGH SENSITIVITY)  TROPONIN I (HIGH SENSITIVITY)    EKG EKG Interpretation  Date/Time:  Sunday Jan 26 2020 14:09:56 EDT Ventricular Rate:  73 PR Interval:    QRS Duration: 109 QT Interval:  405 QTC Calculation: 447 R Axis:   1 Text Interpretation: Sinus rhythm Nonspecific T abnormalities, lateral leads Baseline wander in lead(s) V2 Confirmed by Fredia Sorrow [phone]) on 01/26/2020 2:17:59 PM   Radiology DG Chest Port 1 View  Result Date: 01/26/2020 CLINICAL DATA:   Dizziness since yesterday. Also some shortness of breath with decreased oxygen saturation. EXAM: PORTABLE CHEST 1 VIEW COMPARISON:  08/04/2019 and older studies. FINDINGS: Cardiac silhouette is normal in size. No mediastinal or hilar masses. No evidence of adenopathy. Mild interstitial prominence most evident in the lung bases, stable. Lungs are otherwise clear. No pleural effusion or pneumothorax. Skeletal structures are grossly intact. IMPRESSION: No acute cardiopulmonary disease. Electronically Signed   By: Lajean Manes M.D.   On: 01/26/2020 14:53    Procedures .Critical Care Performed by: Jacqlyn Larsen, PA-C Authorized by: Jacqlyn Larsen, PA-C   Critical care provider statement:    Critical care time (minutes):  45   Critical care was necessary to treat or prevent imminent or life-threatening deterioration of the following conditions:  Respiratory failure (COPD exacerbation with new oxygen requirement)   Critical care was time spent personally by me on the following activities:  Discussions with consultants, evaluation of patient's response to treatment, examination of patient, ordering and performing treatments and interventions, ordering and review of laboratory studies, ordering and review of radiographic studies, pulse oximetry, re-evaluation of patient's condition, obtaining history from patient or surrogate and review of old charts   (including critical care time)  Medications Ordered in ED Medications  ipratropium-albuterol (DUONEB) 0.5-2.5 (3) MG/3ML nebulizer solution 3 mL (has no administration in time range)  ipratropium-albuterol (DUONEB) 0.5-2.5 (3) MG/3ML nebulizer solution 3 mL (3 mLs Nebulization Given 01/26/20 1955)  methylPREDNISolone sodium succinate (SOLU-MEDROL) 125 mg/2 mL injection 60 mg (has no administration in time range)  diltiazem (CARDIZEM CD) 24 hr capsule 240 mg (has no administration in time range)  tamsulosin (FLOMAX) capsule 0.4 mg (has no administration in  time range)  enoxaparin (LOVENOX) injection 40 mg (40 mg Subcutaneous Given 01/26/20 2037)  acetaminophen (TYLENOL) tablet 650 mg (has no administration in time range)    Or  acetaminophen (TYLENOL) suppository 650 mg (has no administration in time range)  ondansetron (ZOFRAN) tablet 4 mg (has no administration in time  range)    Or  ondansetron (ZOFRAN) injection 4 mg (has no administration in time range)  azithromycin (ZITHROMAX) 500 mg in sodium chloride 0.9 % 250 mL IVPB (500 mg Intravenous New Bag/Given 01/26/20 2049)  ipratropium (ATROVENT) 0.02 % nebulizer solution (  Not Given 01/26/20 1852)  guaiFENesin-dextromethorphan (ROBITUSSIN DM) 100-10 MG/5ML syrup 5 mL (has no administration in time range)  aspirin EC tablet 81 mg (81 mg Oral Given 01/26/20 2038)  methylPREDNISolone sodium succinate (SOLU-MEDROL) 125 mg/2 mL injection 125 mg (125 mg Intravenous Given 01/26/20 1446)  albuterol (VENTOLIN HFA) 108 (90 Base) MCG/ACT inhaler 4 puff (4 puffs Inhalation Given 01/26/20 1449)  ipratropium-albuterol (DUONEB) 0.5-2.5 (3) MG/3ML nebulizer solution 3 mL (3 mLs Nebulization Given 01/26/20 1713)    ED Course  I have reviewed the triage vital signs and the nursing notes.  Pertinent labs & imaging results that were available during my care of the patient were reviewed by me and considered in my medical decision making (see chart for details).    MDM Rules/Calculators/A&P                      74 year old male arrives with lightheadedness and shortness of breath starting yesterday, reports this primarily feels like his COPD, not responding to home nebulizer treatments, but he is also felt like he is going to pass out, has not had any actual syncopal episodes.  Denies associated chest pain.  Has had some trace left leg swelling present for the past 2 months, was told by his PCP to elevate and wear compression stocking but it has not been improving, denies calf tenderness, redness or warmth.  On exam  he is requiring 2 L nasal cannula, no usual home oxygen requirement, has some tachypnea and increased respiratory effort, lungs with decreased air movement and wheezing.  Given hypoxia suspect COPD exacerbation but will also evaluate for any associated ischemic heart disease, CHF, or PE given left leg swelling recently.  Will treat with steroids and bronchodilators.  I have independently ordered, reviewed and interpreted all labs and imaging: CBC: No leukocytosis, normal hemoglobin. BMP: Glucose 105, mild hypocalcemia and hyponatremia, no other significant electrolyte derangements, renal function 1.25, slightly elevated from baseline of 0.9. Troponin: Negative x2 D-dimer: Not elevated, doubt PE BNP: Minimally elevated at 119 CXR: No active cardiopulmonary disease, no infiltrate or edema Covid: Negative  Despite treatment with steroids and nebulizer treatments here in the ED patient with continued oxygen requirement, 1 on room air with ambulation he dropped into the 80s, after returning to bed he remained at 88-89% at rest, patient placed back on 2 L nasal cannula.  Will consult for admission for COPD exacerbation.  Case discussed with Dr. Mariea Clonts with [facility] who will see and admit the patient  Final Clinical Impression(s) / ED Diagnoses Final diagnoses:  COPD exacerbation (HCC)  Acute respiratory failure with hypoxia [facility])    Rx / DC Orders ED Discharge Orders    None       Legrand Rams 01/26/20 2201    Benjiman Core, MD 01/26/20 [phone]

## 2020-01-26 NOTE — H&P (Signed)
History and Physical    Aristides Luckey UVO:[id number] DOB: 11/16/1945 DOA: 01/26/2020  PCP: Lucia Gaskins, MD   Patient coming from: Home  I have personally briefly reviewed patient's old medical records in [place]  Chief Complaint: Difficulty breathing  HPI: Cole [name] is a 74 y.o. male with medical history significant for COPD, atrial fibrillation, BPH, hypertension. Patient presented to the ED with complaints of difficulty breathing that started yesterday, but did not improve with his breathing treatments.  He reports a chronic mild cough that is unchanged, he denies chest pain.  He has chronic intermittent lower extremity swelling that is unchanged.  He also reported onset of dizziness also yesterday, mostly present when he stands up, occasionally present when lying down.  Per EMS, in route patient's O2 sat dropped to 83% he was placed on 2 L nasal cannula.  ED Course: At rest O2 sats 91% on room air desats to 87 to 88% with ambulation.  Tachypneic.  Temperature 98.1.  Initial wheezing and chest tightness noted by ED provider.  WBC 8.7.  Troponin 6 > 5.  D-dimer 0.46 within normal limits.  BNP unremarkable 119.  Portable chest x-ray without acute abnormalities.  125mg  Solu-Medrol and bronchodilators given, hospitalist admit for further evaluation and management.  Review of Systems: As per HPI all other systems reviewed and negative.  Past Medical History:  Diagnosis Date  . Atrial fibrillation ([place])   . BPH (benign prostatic hyperplasia)   . COPD (chronic obstructive pulmonary disease) ([place])   . Hypertension     Past Surgical History:  Procedure Laterality Date  . COLONOSCOPY N/A 04/13/2017   Procedure: COLONOSCOPY;  Surgeon: [name], MD;  [facility];  Service: Endoscopy;  Laterality: N/A;  12:00  . HERNIA REPAIR    . POLYPECTOMY  04/13/2017   Procedure: POLYPECTOMY;  Surgeon: [name], MD;  Location: [facility];  Service: Endoscopy;;  colon       reports that he quit smoking about 17 years ago. He has never used smokeless tobacco. He reports that he does not drink alcohol or use drugs.  No Known Allergies  Family History  Problem Relation Age of Onset  . Heart attack Mother 37  . Atrial fibrillation Brother     Prior to Admission medications   Medication Sig Start Date End Date Taking? Authorizing Provider  Cholecalciferol (VITAMIN D3) 5000 units CAPS Take 5,000 Units by mouth daily.   Yes [provider]  diltiazem (CARTIA XT) 240 MG 24 hr capsule Take 1 capsule (240 mg total) by mouth daily. Patient taking differently: Take 240 mg by mouth daily before breakfast.  12/29/16  Yes Dondiego, Richard, MD  Ipratropium-Albuterol (COMBIVENT RESPIMAT) 20-100 MCG/ACT AERS respimat Inhale 1 puff into the lungs every 6 (six) hours as needed for wheezing.   Yes [provider]  ipratropium-albuterol (DUONEB) 0.5-2.5 (3) MG/3ML SOLN Take 3 mLs by nebulization every 4 (four) hours as needed (shortness of breath). For shortness of breath    Yes [provider]  tamsulosin (FLOMAX) 0.4 MG CAPS capsule Take 0.4 mg by mouth daily.    Yes [provider]  lisinopril (ZESTRIL) 20 MG tablet Take 1 tablet by mouth daily as needed (blood pressure).  03/06/19   [provider]    Physical Exam: Vitals:   01/26/20 1430 01/26/20 1500 01/26/20 1530 01/26/20 1705  BP: (!) 127/94 139/64 (!) 138/58 120/82  Pulse: 75 71 68 92  Resp: (!) 21 (!)  22 (!) 22 (!) 24  Temp:    97.8 F (36.6 C)  TempSrc:    Oral  SpO2: 97% 96% 97% 91%  Weight:      Height:        Constitutional: NAD, calm, comfortable Vitals:   01/26/20 1430 01/26/20 1500 01/26/20 1530 01/26/20 1705  BP: (!) 127/94 139/64 (!) 138/58 120/82  Pulse: 75 71 68 92  Resp: (!) 21 (!) 22 (!) 22 (!) 24  Temp:    97.8 F (36.6 C)  TempSrc:    Oral  SpO2: 97% 96% 97% 91%  Weight:      Height:       Eyes: PERRL, lids and conjunctivae  normal ENMT: Mucous membranes are moist.  Neck: normal, supple, no masses, no thyromegaly Respiratory: clear to auscultation bilaterally, no wheezing, no crackles. Normal respiratory effort. No accessory muscle use.  Cardiovascular: Regular rate and rhythm, no murmurs / rubs / gallops.  Trace bilateral extremity edema 2+ pedal pulses. No carotid bruits.  Abdomen: no tenderness, no masses palpated. No hepatosplenomegaly. Bowel sounds positive.  Musculoskeletal: no clubbing / cyanosis. No joint deformity upper and lower extremities. Good ROM, no contractures.  Skin: no rashes, lesions, ulcers. No induration Neurologic: No apparent cranial nerve abnormality, moving all extremities spontaneously Psychiatric: Normal judgment and insight. Alert and oriented x 3. Normal mood.   Labs on Admission: I have personally reviewed following labs and imaging studies  CBC: Recent Labs  Lab 01/26/20 1409  WBC 8.7  NEUTROABS 5.8  HGB 14.5  HCT 44.9  MCV 86.2  PLT 210   Basic Metabolic Panel: Recent Labs  Lab 01/26/20 1409  NA 134*  K 4.1  CL 100  CO2 24  GLUCOSE 105*  BUN 14  CREATININE 1.25*  CALCIUM 8.4*    Radiological Exams on Admission: DG Chest Port 1 View  Result Date: 01/26/2020 CLINICAL DATA:  Dizziness since yesterday. Also some shortness of breath with decreased oxygen saturation. EXAM: PORTABLE CHEST 1 VIEW COMPARISON:  08/04/2019 and older studies. FINDINGS: Cardiac silhouette is normal in size. No mediastinal or hilar masses. No evidence of adenopathy. Mild interstitial prominence most evident in the lung bases, stable. Lungs are otherwise clear. No pleural effusion or pneumothorax. Skeletal structures are grossly intact. IMPRESSION: No acute cardiopulmonary disease. Electronically Signed   By: Amie Portland M.D.   On: 01/26/2020 14:53    EKG: Independently reviewed.  Sinus rhythm, QTC 447.  No significant ST or T wave changes compared to prior  EKG.  Assessment/Plan Principal Problem:   CHRONIC OBSTRUCTIVE PULMONARY DISEASE, ACUTE EXACERBATION Active Problems:   Atrial fibrillation with RVR (HCC)    COPD exacerbation with acute respiratory failure - dyspnea, initial wheezing and chest tightness on arrival, O2 sats dropped to 87 -88% with ambulation.  Portable chest x-ray unremarkable.  WBC 8.7.  D-dimer within normal limits.  Unremarkable troponin and BNP.  COVID 19 test negative. -IV Solu-Medrol 125 mg given, continue 60 mg every 12 hourly -DuoNebs scheduled, as needed -IV azithromycin -Mucolytic's PRN -supplemental oxygen  Dizziness-likely related to hypoxia.  Mostly present when ambulating. -Orthostatic vitals. -PT evaluation -If persistent, pending clinical course, consider imaging  Atrial fibrillation- rate controlled.  Currently in sinus rhythm.  Not on anticoagulation.  Per notes he was hospitalized for A. fib with RVR 2018 and at that time he was discharged home on Xarelto.  Patient subsequently presented to the ED 2 weeks later with blood in his urine.  He followed up  with his urologist, diagnosed with BPH and Xarelto was discontinued. He denies frequent falls or history of GI blood loss or black or bloody stools.  CHA2DS2-VASc score at least 3 (age and hypertension). -Resume home Cardizem -I have held off on restarting anticoagulation at this time. -Will start aspirin  Hypertension-stable blood pressure. -Resume home Cardizem, tamsulosin -Hold home as needed lisinopril  BPH-  -Resume home tamsulosin  DVT prophylaxis: Lovenox Code Status: Full code Family Communication: Spouse at bedside.   Disposition Plan: 1- 2 days Consults called: none Admission status: Observation, telemetry   Cesare Sumlin Wendall Stade MD [facility]  01/26/2020, 6:01 PM

## 2020-01-27 DIAGNOSIS — J441 Chronic obstructive pulmonary disease with (acute) exacerbation: Principal | ICD-10-CM

## 2020-01-27 MED ORDER — METHYLPREDNISOLONE SODIUM SUCC 40 MG IJ SOLR
40.0000 mg | Freq: Three times a day (TID) | INTRAMUSCULAR | Status: DC
  Administered 2020-01-28: 40 mg via INTRAVENOUS
  Filled 2020-01-27 (×2): qty 1

## 2020-01-27 NOTE — Evaluation (Signed)
Physical Therapy Evaluation Patient Details Name: Cole [name] MRN: [id number] DOB: 27-May-1946 Today's Date: 01/27/2020   History of Present Illness  Cole [name] is a 74 y.o. male with medical history significant for COPD, atrial fibrillation, BPH, hypertension.Patient presented to the ED with complaints of difficulty breathing that started yesterday, but did not improve with his breathing treatments.  He reports a chronic mild cough that is unchanged, he denies chest pain.  He has chronic intermittent lower extremity swelling that is unchanged.  He also reported onset of dizziness also yesterday, mostly present when he stands up, occasionally present when lying down.    Clinical Impression  Patient functioning near baseline for functional mobility and gait, ambulated in room and hallway without loss of balance while on room air with SpO2 at 94-96% and tolerated sitting up at bedside with spouse present after therapy.  Orthostatics as follows: lying 160/69, sitting 156/70, and standing 154/70 - RN aware.  Plan:  Patient discharged from physical therapy to care of nursing for ambulation daily as tolerated for length of stay.     Follow Up Recommendations No PT follow up;Supervision for mobility/OOB;Supervision - Intermittent    Equipment Recommendations  None recommended by PT    Recommendations for Other Services       Precautions / Restrictions Precautions Precautions: None Restrictions Weight Bearing Restrictions: No      Mobility  Bed Mobility Overal bed mobility: Modified Independent             General bed mobility comments: HOB raised  Transfers Overall transfer level: Modified independent                  Ambulation/Gait Ambulation/Gait assistance: Modified independent (Device/Increase time) Gait Distance (Feet): 150 Feet Assistive device: None Gait Pattern/deviations: WFL(Within Functional Limits) Gait velocity: decreased   General Gait Details: grossly WFL,  demonstrates good return for ambulation in room and hallways without loss of balance, limited secondary to fatigue  Stairs            Wheelchair Mobility    Modified Rankin (Stroke Patients Only)       Balance Overall balance assessment: No apparent balance deficits (not formally assessed)                                           Pertinent Vitals/Pain Pain Assessment: No/denies pain    Home Living Family/patient expects to be discharged to:: Private residence Living Arrangements: Spouse/significant other Available Help at Discharge: Family;Available PRN/intermittently Type of Home: House Home Access: Stairs to enter Entrance Stairs-Rails: Right;Left;Can reach both Entrance Stairs-Number of Steps: 7 Home Layout: One level Home Equipment: Walker - 2 wheels;Cane - single point      Prior Function Level of Independence: Independent         Comments: Tourist information centre manager, drives, works     Higher education careers adviser        Extremity/Trunk Assessment   Upper Extremity Assessment Upper Extremity Assessment: Overall WFL for tasks assessed    Lower Extremity Assessment Lower Extremity Assessment: Overall WFL for tasks assessed    Cervical / Trunk Assessment Cervical / Trunk Assessment: Normal  Communication   Communication: No difficulties  Cognition Arousal/Alertness: Awake/alert Behavior During Therapy: WFL for tasks assessed/performed Overall Cognitive Status: Within Functional Limits for tasks assessed  General Comments      Exercises     Assessment/Plan    PT Assessment Patent does not need any further PT services  PT Problem List         PT Treatment Interventions      PT Goals (Current goals can be found in the Care Plan section)  Acute Rehab PT Goals Patient Stated Goal: return home with family to assist PT Goal Formulation: With patient/family Time For Goal Achievement:  01/27/20 Potential to Achieve Goals: Good    Frequency     Barriers to discharge        Co-evaluation               AM-PAC PT "6 Clicks" Mobility  Outcome Measure Help needed turning from your back to your side while in a flat bed without using bedrails?: None Help needed moving from lying on your back to sitting on the side of a flat bed without using bedrails?: None Help needed moving to and from a bed to a chair (including a wheelchair)?: None Help needed standing up from a chair using your arms (e.g., wheelchair or bedside chair)?: None Help needed to walk in hospital room?: None Help needed climbing 3-5 steps with a railing? : None 6 Click Score: 24    End of Session   Activity Tolerance: Patient tolerated treatment well;Patient limited by fatigue Patient left: in bed;with call bell/phone within reach;with family/visitor present Nurse Communication: Mobility status PT Visit Diagnosis: Unsteadiness on feet (R26.81);Other abnormalities of gait and mobility (R26.89);Muscle weakness (generalized) (M62.81)    Time: 8850-2774 PT Time Calculation (min) (ACUTE ONLY): 26 min   Charges:   PT Evaluation $PT Eval Moderate Complexity: 1 Mod PT Treatments $Therapeutic Activity: 23-37 mins        2:39 PM, 01/27/20 Lonell Grandchild, MPT Physical Therapist with [facility] 336 [phone] office [phone] mobile phone

## 2020-01-27 NOTE — Progress Notes (Signed)
Patient Demographics:    Cole [name], is a 74 y.o. male, DOB - [birth year], PPI:[id number]  Admit date - 01/26/2020   Admitting Physician Ejiroghene Wendall Stade, MD  Outpatient Primary MD for the patient is Oval Linsey, MD  LOS - 1   Chief Complaint  Patient presents with  . Dizziness    Lightheadedness  . Shortness of Breath        Subjective:    Cole [name] today has no fevers, no emesis,  No chest pain,   --Patient's wife at bedside -Cough and shortness of breath persist -As per patient's wife after ambulating with physical therapist patient had a lot more cough and a lot more shortness of breath  Assessment  & Plan :    Principal Problem:   CHRONIC OBSTRUCTIVE PULMONARY DISEASE, ACUTE EXACERBATION Active Problems:   Atrial fibrillation with [facility])  Brief Summary:- 74 y.o. male with medical history significant for COPD, atrial fibrillation, BPH, hypertension admitted on 01/26/2020 with acute decubitus exacerbation and new onset hypoxia  A/p  1) acute hypoxic respiratory failure--As per patient's wife after ambulating with physical therapist patient had a lot more cough and a lot more shortness of breath --Continue IV Solu-Medrol, bronchodilators, mucolytics and -- --hypoxia appears to be resolving  2) chronic atrial fibrillation--- dizziness persist,   CHA2DS2- VASc score   is = 3 (age x 2, HTN)   Which is  equal to = 3.2  % annual risk of stroke  -Get echocardiogram, -Continue Cardizem for rate control, if EF is low on echo we will discontinue Cardizem -Patient is off anticoagulation due to prior hematuria in 2018  3)BPH--patient is on Flomax, this may be contributing to patient's dizziness  4) ambulatory dysfunction/gait concerns--- physical therapy eval appreciated  Disposition/Need for in-Hospital Stay- patient unable to be discharged at this time due to --persistent respiratory  symptoms requiring bronchodilators, IV steroids, echocardiogram -Patient From: home D/C Place: home Barriers: Not Clinically Stable-   Code Status : full  Family Communication:   (patient is alert, awake and coherent) -Discussed with patient's wife at bedside  Consults  :  na  DVT Prophylaxis  :  Lovenox - SCDs  Lab Results  Component Value Date   PLT 210 01/26/2020    Inpatient Medications  Scheduled Meds: . aspirin EC  81 mg Oral Daily  . diltiazem  240 mg Oral QAC breakfast  . enoxaparin (LOVENOX) injection  40 mg Subcutaneous Q24H  . methylPREDNISolone (SOLU-MEDROL) injection  60 mg Intravenous Q12H  . tamsulosin  0.4 mg Oral Daily   Continuous Infusions: . azithromycin 500 mg (01/27/20 1719)   PRN Meds:.acetaminophen **OR** acetaminophen, guaiFENesin-dextromethorphan, ipratropium-albuterol, ondansetron **OR** ondansetron (ZOFRAN) IV    Anti-infectives (From admission, onward)   Start     Dose/Rate Route Frequency Ordered Stop   01/26/20 1815  azithromycin (ZITHROMAX) 500 mg in sodium chloride 0.9 % 250 mL IVPB     500 mg 250 mL/hr over 60 Minutes Intravenous Every 24 hours 01/26/20 1808          Objective:   Vitals:   01/27/20 0730 01/27/20 0752 01/27/20 0846 01/27/20 1307  BP: 129/74  129/74 (!) 141/64  Pulse: 86   82  Resp: 18   19  Temp: 98.7 F (37.1 C)   (!) 97.5 F (36.4 C)  TempSrc: Oral   Oral  SpO2: 93% 92%  92%  Weight:      Height:        Wt Readings from Last 3 Encounters:  01/26/20 88.5 kg  08/04/19 90.7 kg  06/07/18 90.7 kg     Intake/Output Summary (Last 24 hours) at 01/27/2020 1803 Last data filed at 01/27/2020 1700 Gross per 24 hour  Intake 1090.61 ml  Output 500 ml  Net 590.61 ml     Physical Exam  Gen:- Awake Alert,  In no apparent distress  HEENT:- [place].AT, No sclera icterus Nose- [place] 2L/min Neck-Supple Neck,No JVD,.  Lungs-diminished in bases, few scattered wheezes CV- S1, S2 normal, irregularly irregular,  abd-   +ve B.Sounds, Abd Soft, No tenderness,    Extremity/Skin:- No  edema, pedal pulses present  Psych-affect is appropriate, oriented x3 Neuro-generalized weakness, no new focal deficits, no tremors   Data Review:   Micro Results Recent Results (from the past 240 hour(s))  SARS Coronavirus 2 by RT PCR (hospital order, performed in [facility] hospital lab) Nasopharyngeal Nasopharyngeal Swab     Status: None   Collection Time: 01/26/20  2:41 PM   Specimen: Nasopharyngeal Swab  Result Value Ref Range Status   SARS Coronavirus 2 NEGATIVE NEGATIVE Final    Comment: (NOTE) SARS-CoV-2 target nucleic acids are NOT DETECTED. The SARS-CoV-2 RNA is generally detectable in upper and lower respiratory specimens during the acute phase of infection. The lowest concentration of SARS-CoV-2 viral copies this assay can detect is 250 copies / mL. A negative result does not preclude SARS-CoV-2 infection and should not be used as the sole basis for treatment or other patient management decisions.  A negative result may occur with improper specimen collection / handling, submission of specimen other than nasopharyngeal swab, presence of viral mutation(s) within the areas targeted by this assay, and inadequate number of viral copies (<250 copies / mL). A negative result must be combined with clinical observations, patient history, and epidemiological information. Fact Sheet for Patients:   StrictlyIdeas.no Fact Sheet for Healthcare Providers: BankingDealers.co.za This test is not yet approved or cleared  by the [place] FDA and has been authorized for detection and/or diagnosis of SARS-CoV-2 by FDA under an Emergency Use Authorization (EUA).  This EUA will remain in effect (meaning this test can be used) for the duration of the COVID-19 declaration under Section 564(b)(1) of the Act, 21 U.S.C. section 360bbb-3(b)(1), unless the authorization is terminated  or revoked sooner. Performed at [facility], [address] [place], [place] [address]     Radiology Reports DG Chest [place] 1 View  Result Date: 01/26/2020 CLINICAL DATA:  Dizziness since yesterday. Also some shortness of breath with decreased oxygen saturation. EXAM: PORTABLE CHEST 1 VIEW COMPARISON:  08/04/2019 and older studies. FINDINGS: Cardiac silhouette is normal in size. No mediastinal or hilar masses. No evidence of adenopathy. Mild interstitial prominence most evident in the lung bases, stable. Lungs are otherwise clear. No pleural effusion or pneumothorax. Skeletal structures are grossly intact. IMPRESSION: No acute cardiopulmonary disease. Electronically Signed   By: Lajean Manes M.D.   On: 01/26/2020 14:53     CBC Recent Labs  Lab 01/26/20 1409  WBC 8.7  HGB 14.5  HCT 44.9  PLT 210  MCV 86.2  MCH 27.8  MCHC 32.3  RDW 13.0  LYMPHSABS 1.7  MONOABS 0.9  EOSABS 0.2  BASOSABS 0.1  Chemistries  Recent Labs  Lab 01/26/20 1409  NA 134*  K 4.1  CL 100  CO2 24  GLUCOSE 105*  BUN 14  CREATININE 1.25*  CALCIUM 8.4*   ------------------------------------------------------------------------------------------------------------------ No results for input(s): CHOL, HDL, LDLCALC, TRIG, CHOLHDL, LDLDIRECT in the last 72 hours.  No results found for: HGBA1C ------------------------------------------------------------------------------------------------------------------ No results for input(s): TSH, T4TOTAL, T3FREE, THYROIDAB in the last 72 hours.  Invalid input(s): FREET3 ------------------------------------------------------------------------------------------------------------------ No results for input(s): VITAMINB12, FOLATE, FERRITIN, TIBC, IRON, RETICCTPCT in the last 72 hours.  Coagulation profile No results for input(s): INR, PROTIME in the last 168 hours.  Recent Labs    01/26/20 1409  DDIMER 0.46    Cardiac Enzymes No results for input(s): CKMB,  TROPONINI, MYOGLOBIN in the last 168 hours.  Invalid input(s): CK ------------------------------------------------------------------------------------------------------------------    Component Value Date/Time   BNP 119.0 (H) 01/26/2020 1409     Shon Hale M.D on 01/27/2020 at 6:03 PM  Go to [email or web address] - for contact info  [facility] - Office  [phone]

## 2020-01-28 ENCOUNTER — Inpatient Hospital Stay (HOSPITAL_COMMUNITY): Payer: Medicare PPO

## 2020-01-28 DIAGNOSIS — R0600 Dyspnea, unspecified: Secondary | ICD-10-CM

## 2020-01-28 LAB — BASIC METABOLIC PANEL
Anion gap: 10 (ref 5–15)
BUN: 33 mg/dL — ABNORMAL HIGH (ref 8–23)
CO2: 24 mmol/L (ref 22–32)
Calcium: 8.8 mg/dL — ABNORMAL LOW (ref 8.9–10.3)
Chloride: 104 mmol/L (ref 98–111)
Creatinine, Ser: 0.95 mg/dL (ref 0.61–1.24)
GFR calc Af Amer: >60 mL/min (ref >60)
GFR calc non Af Amer: >60 mL/min (ref >60)
Glucose, Bld: 128 mg/dL — ABNORMAL HIGH (ref 70–99)
Potassium: 4 mmol/L (ref 3.5–5.1)
Sodium: 138 mmol/L (ref 135–145)

## 2020-01-28 LAB — ECHOCARDIOGRAM COMPLETE
Height: 66 in
Weight: 3120 oz

## 2020-01-28 LAB — TSH: TSH: 0.925 u[IU]/mL (ref 0.350–4.500)

## 2020-01-28 MED ORDER — ALBUTEROL SULFATE HFA 108 (90 BASE) MCG/ACT IN AERS
2.0000 | INHALATION_SPRAY | Freq: Four times a day (QID) | RESPIRATORY_TRACT | 3 refills | Status: AC | PRN
Start: 2020-01-28 — End: ?

## 2020-01-28 MED ORDER — AZITHROMYCIN 500 MG PO TABS
500.0000 mg | ORAL_TABLET | Freq: Every day | ORAL | 0 refills | Status: AC
Start: 2020-01-28 — End: 2020-01-31

## 2020-01-28 MED ORDER — PREDNISONE 20 MG PO TABS: 40.0000 mg | ORAL_TABLET | Freq: Every day | ORAL | 0 refills | Status: DC

## 2020-01-28 MED ORDER — STIOLTO RESPIMAT 2.5-2.5 MCG/ACT IN AERS: 2.0000 | INHALATION_SPRAY | Freq: Every day | RESPIRATORY_TRACT | 3 refills | Status: DC

## 2020-01-28 MED ORDER — ASPIRIN 81 MG PO TBEC: 81.0000 mg | DELAYED_RELEASE_TABLET | Freq: Every day | ORAL | 2 refills | Status: DC

## 2020-01-28 MED ORDER — MUCINEX 600 MG PO TB12: 600.0000 mg | ORAL_TABLET | Freq: Two times a day (BID) | ORAL | 0 refills | Status: AC

## 2020-01-28 MED ORDER — ALBUTEROL SULFATE (2.5 MG/3ML) 0.083% IN NEBU
2.5000 mg | INHALATION_SOLUTION | Freq: Four times a day (QID) | RESPIRATORY_TRACT | 12 refills | Status: DC | PRN
Start: 2020-01-28 — End: 2023-05-11

## 2020-01-28 MED ORDER — IPRATROPIUM-ALBUTEROL 0.5-2.5 (3) MG/3ML IN SOLN: 3.0000 mL | RESPIRATORY_TRACT | 2 refills | Status: DC | PRN

## 2020-01-28 NOTE — Discharge Summary (Signed)
Cole [name], is a 74 y.o. male  DOB [birth year]  MRN [id number].  Admission date:  01/26/2020  Admitting Physician  Onnie Boer, MD  Discharge Date:  01/28/2020   Primary MD  Oval Linsey, MD  Recommendations for primary care physician for things to follow:   1)Please take prednisone, azithromycin and bronchodilators as prescribed for your COPD/lung inflammation  2)Given your underlying lung disease/chronic obstructive pulmonary disease and your advanced age-----you are high risk for complications should you contract COVID-19/coronavirus respiratory infection , SO you are strongly advised to go get the COVID-19 vaccine to SIGNIFICANTLY reduce your risk of getting very sick or dying from COVID-19 infection  3)Avoid ibuprofen/Advil/Aleve/Motrin/Goody Powders/Naproxen/BC powders/Meloxicam/Diclofenac/Indomethacin and other Nonsteroidal anti-inflammatory medications as these will make you more likely to bleed and can cause stomach ulcers, can also cause Kidney problems.   Admission Diagnosis  COPD exacerbation (HCC) [J44.1] Acute respiratory failure with hypoxia (HCC) [J96.01]   Discharge Diagnosis  COPD exacerbation (HCC) [J44.1] Acute respiratory failure with hypoxia (HCC) [J96.01]    Principal Problem:   CHRONIC OBSTRUCTIVE PULMONARY DISEASE, ACUTE EXACERBATION Active Problems:   Atrial fibrillation with RVR (HCC)      Past Medical History:  Diagnosis Date  . Atrial fibrillation (HCC)   . BPH (benign prostatic hyperplasia)   . COPD (chronic obstructive pulmonary disease) (HCC)   . Hypertension     Past Surgical History:  Procedure Laterality Date  . COLONOSCOPY N/A 04/13/2017   Procedure: COLONOSCOPY;  Surgeon: Malissa Hippo, MD;  [facility];  Service: Endoscopy;  Laterality: N/A;  12:00  . HERNIA REPAIR    . POLYPECTOMY  04/13/2017   Procedure: POLYPECTOMY;  Surgeon: Malissa Hippo, MD;  Location: [facility];  Service: Endoscopy;;  colon       HPI  from the history and physical done on the day of admission:     Chief Complaint: Difficulty breathing  HPI: Cole [name] is a 74 y.o. male with medical history significant for COPD, atrial fibrillation, BPH, hypertension. Patient presented to the ED with complaints of difficulty breathing that started yesterday, but did not improve with his breathing treatments.  He reports a chronic mild cough that is unchanged, he denies chest pain.  He has chronic intermittent lower extremity swelling that is unchanged.  He also reported onset of dizziness also yesterday, mostly present when he stands up, occasionally present when lying down.  Per EMS, in route patient's O2 sat dropped to 83% he was placed on 2 L nasal cannula.  ED Course: At rest O2 sats 91% on room air desats to 87 to 88% with ambulation.  Tachypneic.  Temperature 98.1.  Initial wheezing and chest tightness noted by ED provider.  WBC 8.7.  Troponin 6 > 5.  D-dimer 0.46 within normal limits.  BNP unremarkable 119.  Portable chest x-ray without acute abnormalities.  125mg  Solu-Medrol and bronchodilators given, hospitalist admit for further evaluation and management.  Review of Systems: As per HPI all other systems reviewed and negative  Hospital Course:     Brief Summary:- 74 y.o.malewith medical history significant forCOPD, atrial fibrillation, BPH, hypertension admitted on 01/26/2020 with acute decubitus exacerbation and new onset hypoxia  A/p  1)Acute Hypoxic Respiratory Failure-- --Treated with IV Solu-Medrol, and azithro bronchodilators, mucolytics and -- --hypoxia has resolved -Okay to discharge on p.o. azithromycin and prednisone  2) chronic atrial fibrillation--- dizziness persist,  CHA2DS2- VASc score   is = 3 (age x 2, HTN)   Which is  equal to = 3.2  % annual risk of stroke  -Get echocardiogram, -Continue Cardizem for rate control,  as  EF is wnl  55 to 60%  -Patient is off anticoagulation due to prior hematuria in 2018 -Patient does not wish to restart anticoagulation at this time  3)BPH--patient is on Flomax, this may be contributing to patient's dizziness    Disposition/--discharge home -Patient From: home D/C Place: home  Code Status : full  Family Communication:   (patient is alert, awake and coherent) -Discussed with patient's wife at bedside  Consults  :  na  Discharge Condition: Stable without hypoxia  Follow UP--- PCP as advised   Diet and Activity recommendation:  As advised  Discharge Instructions    Discharge Instructions    Call MD for:  difficulty breathing, headache or visual disturbances   Complete by: As directed    Call MD for:  persistant dizziness or light-headedness   Complete by: As directed    Call MD for:  persistant nausea and vomiting   Complete by: As directed    Call MD for:  severe uncontrolled pain   Complete by: As directed    Call MD for:  temperature >100.4   Complete by: As directed    Diet - low sodium heart healthy   Complete by: As directed    Discharge instructions   Complete by: As directed    1) please take prednisone, azithromycin and bronchodilators as prescribed for your COPD/lung inflammation  2)Given your underlying lung disease/chronic obstructive pulmonary disease and your advanced age-----you are high risk for complications should you contract COVID-19/coronavirus respiratory infection , SO you are strongly advised to go get the COVID-19 vaccine to SIGNIFICANTLY reduce your risk of getting very sick or dying from COVID-19 infection  3)Avoid ibuprofen/Advil/Aleve/Motrin/Goody Powders/Naproxen/BC powders/Meloxicam/Diclofenac/Indomethacin and other Nonsteroidal anti-inflammatory medications as these will make you more likely to bleed and can cause stomach ulcers, can also cause Kidney problems.   Increase activity slowly   Complete by: As  directed         Discharge Medications     Allergies as of 01/28/2020   No Known Allergies     Medication List    STOP taking these medications   Combivent Respimat 20-100 MCG/ACT Aers respimat Generic drug: Ipratropium-Albuterol Replaced by: Stiolto Respimat 2.5-2.5 MCG/ACT Aers   ipratropium-albuterol 0.5-2.5 (3) MG/3ML Soln Commonly known as: DUONEB     TAKE these medications   albuterol (2.5 MG/3ML) 0.083% nebulizer solution Commonly known as: PROVENTIL Take 3 mLs (2.5 mg total) by nebulization every 6 (six) hours as needed for wheezing or shortness of breath.   albuterol 108 (90 Base) MCG/ACT inhaler Commonly known as: VENTOLIN HFA Inhale 2 puffs into the lungs every 6 (six) hours as needed for wheezing or shortness of breath.   aspirin 81 MG EC tablet Take 1 tablet (81 mg total) by mouth daily with breakfast.   azithromycin 500 MG tablet Commonly known as: ZITHROMAX Take 1 tablet (500 mg total) by mouth daily  for 3 days. For COPD   diltiazem 240 MG 24 hr capsule Commonly known as: Cartia XT Take 1 capsule (240 mg total) by mouth daily. What changed: when to take this   lisinopril 20 MG tablet Commonly known as: ZESTRIL Take 1 tablet by mouth daily as needed (blood pressure).   Mucinex 600 MG 12 hr tablet Generic drug: guaiFENesin Take 1 tablet (600 mg total) by mouth 2 (two) times daily for 10 days.   predniSONE 20 MG tablet Commonly known as: Deltasone Take 2 tablets (40 mg total) by mouth daily with breakfast.   Stiolto Respimat 2.5-2.5 MCG/ACT Aers Generic drug: Tiotropium Bromide-Olodaterol Inhale 2 puffs into the lungs daily. Replaces: Combivent Respimat 20-100 MCG/ACT Aers respimat   tamsulosin 0.4 MG Caps capsule Commonly known as: FLOMAX Take 0.4 mg by mouth daily.   Vitamin D3 125 MCG (5000 UT) Caps Take 5,000 Units by mouth daily.       Major procedures and Radiology Reports - PLEASE review detailed and final reports for all  details, in brief -   DG Chest Port 1 View  Result Date: 01/26/2020 CLINICAL DATA:  Dizziness since yesterday. Also some shortness of breath with decreased oxygen saturation. EXAM: PORTABLE CHEST 1 VIEW COMPARISON:  08/04/2019 and older studies. FINDINGS: Cardiac silhouette is normal in size. No mediastinal or hilar masses. No evidence of adenopathy. Mild interstitial prominence most evident in the lung bases, stable. Lungs are otherwise clear. No pleural effusion or pneumothorax. Skeletal structures are grossly intact. IMPRESSION: No acute cardiopulmonary disease. Electronically Signed   By: Amie Portland M.D.   On: 01/26/2020 14:53   ECHOCARDIOGRAM COMPLETE  Result Date: 01/28/2020    ECHOCARDIOGRAM REPORT   Patient Name:   DHAVAL WOO  Date of Exam: 01/28/2020 Medical Rec #:  [id number]  Height:       66.0 in Accession #:    [id number] Weight:       195.0 lb Date of Birth:  06/12/1946   BSA:          1.978 m Patient Age:    74 years   BP:           129/42 mmHg Patient Gender: M          HR:           80 bpm. [facility] Procedure: 2D Echo Indications:    Dyspnea 786.09 / R06.00  History:        Patient has no prior history of Echocardiogram examinations.                 COPD, Arrythmias:Atrial Fibrillation; Risk Factors:Former Smoker                 and Hypertension.  Sonographer:    Jeryl Columbia RDCS (AE) Referring Phys: [id number] Dameer Speiser IMPRESSIONS  1. Left ventricular ejection fraction, by estimation, is 55 to 60%. The left ventricle has normal function. The left ventricle has no regional wall motion abnormalities. There is mild left ventricular hypertrophy. Left ventricular diastolic parameters are indeterminate.  2. Right ventricular systolic function is normal. The right ventricular size is normal.  3. The mitral valve was not well visualized. No evidence of mitral valve regurgitation. No evidence of mitral stenosis.  4. The aortic valve was not well visualized. Aortic valve  regurgitation is not visualized. No aortic stenosis is present.  5. The inferior vena cava is normal in size with greater than 50% respiratory variability, suggesting right  atrial pressure of 3 mmHg. FINDINGS  Left Ventricle: Left ventricular ejection fraction, by estimation, is 55 to 60%. The left ventricle has normal function. The left ventricle has no regional wall motion abnormalities. The left ventricular internal cavity size was normal in size. There is  mild left ventricular hypertrophy. Left ventricular diastolic parameters are indeterminate. Right Ventricle: The right ventricular size is normal. No increase in right ventricular wall thickness. Right ventricular systolic function is normal. Left Atrium: Left atrial size was normal in size. Right Atrium: Right atrial size was normal in size. Pericardium: There is no evidence of pericardial effusion. Mitral Valve: The mitral valve was not well visualized. No evidence of mitral valve regurgitation. No evidence of mitral valve stenosis. Tricuspid Valve: The tricuspid valve is not well visualized. Tricuspid valve regurgitation is not demonstrated. No evidence of tricuspid stenosis. Aortic Valve: The aortic valve was not well visualized. Aortic valve regurgitation is not visualized. No aortic stenosis is present. Aortic valve mean gradient measures 7.8 mmHg. Aortic valve peak gradient measures 15.5 mmHg. Aortic valve area, by VTI measures 1.79 cm. Pulmonic Valve: The pulmonic valve was not well visualized. Pulmonic valve regurgitation is not visualized. No evidence of pulmonic stenosis. Aorta: The aortic root is normal in size and structure. Pulmonary Artery: Indeterminant PASP, inadequate TR jet. Venous: The inferior vena cava is normal in size with greater than 50% respiratory variability, suggesting right atrial pressure of 3 mmHg. IAS/Shunts: The interatrial septum was not well visualized.  LEFT VENTRICLE PLAX 2D LVIDd:         5.30 cm  Diastology LVIDs:          3.97 cm  LV e' medial:   8.59 cm/s LV PW:         1.04 cm  LV E/e' medial: 6.9 LV IVS:        1.26 cm LVOT diam:     2.00 cm LV SV:         61 LV SV Index:   31 LVOT Area:     3.14 cm  RIGHT VENTRICLE RV S prime:     20.50 cm/s TAPSE (M-mode): 3.6 cm LEFT ATRIUM             Index       RIGHT ATRIUM           Index LA diam:        3.10 cm 1.57 cm/m  RA Area:     13.80 cm LA Vol (A2C):   30.1 ml 15.21 ml/m RA Volume:   33.20 ml  16.78 ml/m LA Vol (A4C):   36.5 ml 18.45 ml/m LA Biplane Vol: 32.0 ml 16.17 ml/m  AORTIC VALVE AV Area (Vmax):    1.66 cm AV Area (Vmean):   1.48 cm AV Area (VTI):     1.79 cm AV Vmax:           196.82 cm/s AV Vmean:          133.339 cm/s AV VTI:            0.343 m AV Peak Grad:      15.5 mmHg AV Mean Grad:      7.8 mmHg LVOT Vmax:         104.29 cm/s LVOT Vmean:        62.856 cm/s LVOT VTI:          0.195 m LVOT/AV VTI ratio: 0.57  AORTA Ao Root diam: 2.80 cm MITRAL VALVE MV Area (PHT):  2.76 cm    SHUNTS MV Decel Time: 275 msec    Systemic VTI:  0.19 m MV E velocity: 59.20 cm/s  Systemic Diam: 2.00 cm MV A velocity: 64.00 cm/s MV E/A ratio:  0.92 Dina Rich MD Electronically signed by Dina Rich MD Signature Date/Time: 01/28/2020/11:26:35 AM    Final     Micro Results   Recent Results (from the past 240 hour(s))  SARS Coronavirus 2 by RT PCR (hospital order, performed in [facility] hospital lab) Nasopharyngeal Nasopharyngeal Swab     Status: None   Collection Time: 01/26/20  2:41 PM   Specimen: Nasopharyngeal Swab  Result Value Ref Range Status   SARS Coronavirus 2 NEGATIVE NEGATIVE Final    Comment: (NOTE) SARS-CoV-2 target nucleic acids are NOT DETECTED. The SARS-CoV-2 RNA is generally detectable in upper and lower respiratory specimens during the acute phase of infection. The lowest concentration of SARS-CoV-2 viral copies this assay can detect is 250 copies / mL. A negative result does not preclude SARS-CoV-2 infection and should not be used as the  sole basis for treatment or other patient management decisions.  A negative result may occur with improper specimen collection / handling, submission of specimen other than nasopharyngeal swab, presence of viral mutation(s) within the areas targeted by this assay, and inadequate number of viral copies (<250 copies / mL). A negative result must be combined with clinical observations, patient history, and epidemiological information. Fact Sheet for Patients:   BoilerBrush.com.cy Fact Sheet for Healthcare Providers: https://pope.com/ This test is not yet approved or cleared  by the [place] FDA and has been authorized for detection and/or diagnosis of SARS-CoV-2 by FDA under an Emergency Use Authorization (EUA).  This EUA will remain in effect (meaning this test can be used) for the duration of the COVID-19 declaration under Section 564(b)(1) of the Act, 21 U.S.C. section 360bbb-3(b)(1), unless the authorization is terminated or revoked sooner. Performed at [facility], [address], [place], [place] [address]        Today   Subjective    Kary Colaizzi today has no new complaints No fever  Or chills   No Nausea, Vomiting or Diarrhea       Patient has been seen and examined prior to discharge   Objective   Blood pressure 139/76, pulse 80, temperature 97.6 F (36.4 C), resp. rate 19, height 5\' 6"  (1.676 m), weight 88.5 kg, SpO2 94 %.   Intake/Output Summary (Last 24 hours) at 01/28/2020 1421 Last data filed at 01/27/2020 1700 Gross per 24 hour  Intake 240 ml  Output --  Net 240 ml    Exam Gen:- Awake Alert, no acute distress  HEENT:- [place].AT, No sclera icterus Neck-Supple Neck,No JVD,.  Lungs-much improved air movement, no wheezing CV- S1, S2 normal, irregular Abd-  +ve B.Sounds, Abd Soft, No tenderness,    Extremity/Skin:- No  edema,   good pulses Psych-affect is appropriate, oriented x3 Neuro-no new focal deficits,  no tremors    Data Review   CBC w Diff:  Lab Results  Component Value Date   WBC 8.7 01/26/2020   HGB 14.5 01/26/2020   HCT 44.9 01/26/2020   PLT 210 01/26/2020   LYMPHOPCT 20 01/26/2020   MONOPCT 11 01/26/2020   EOSPCT 2 01/26/2020   BASOPCT 1 01/26/2020    CMP:  Lab Results  Component Value Date   NA 138 01/28/2020   K 4.0 01/28/2020   CL 104 01/28/2020   CO2 24 01/28/2020  BUN 33 (H) 01/28/2020   CREATININE 0.95 01/28/2020   PROT 7.1 01/10/2017   ALBUMIN 3.7 01/10/2017   BILITOT 0.7 01/10/2017   ALKPHOS 90 01/10/2017   AST 18 01/10/2017   ALT 24 01/10/2017  .   Total Discharge time is about 33 minutes  Shon Hale M.D on 01/28/2020 at 2:21 PM  Go to [email or web address] -  for contact info  [facility] - Office  [phone]

## 2020-01-28 NOTE — Discharge Instructions (Signed)
1) please take prednisone, azithromycin and bronchodilators as prescribed for your COPD/lung inflammation  2)Given your underlying lung disease/chronic obstructive pulmonary disease and your advanced age-----you are high risk for complications should you contract COVID-19/coronavirus respiratory infection , SO you are strongly advised to go get the COVID-19 vaccine to SIGNIFICANTLY reduce your risk of getting very sick or dying from COVID-19 infection  3)Avoid ibuprofen/Advil/Aleve/Motrin/Goody Powders/Naproxen/BC powders/Meloxicam/Diclofenac/Indomethacin and other Nonsteroidal anti-inflammatory medications as these will make you more likely to bleed and can cause stomach ulcers, can also cause Kidney problems.

## 2020-01-28 NOTE — Progress Notes (Signed)
*  PRELIMINARY RESULTS* Echocardiogram 2D Echocardiogram has been performed.  Jeryl Columbia 01/28/2020, 10:52 AM

## 2020-04-20 IMAGING — DX DG CHEST 1V PORT
2 series · 2 of 2 positions shown · non-contrast
Comparison: 06/07/2018

CLINICAL DATA: Shortness of breath and tachycardia. Productive
cough.

EXAM:
PORTABLE CHEST 1 VIEW

[chest ap (1 of 2)]
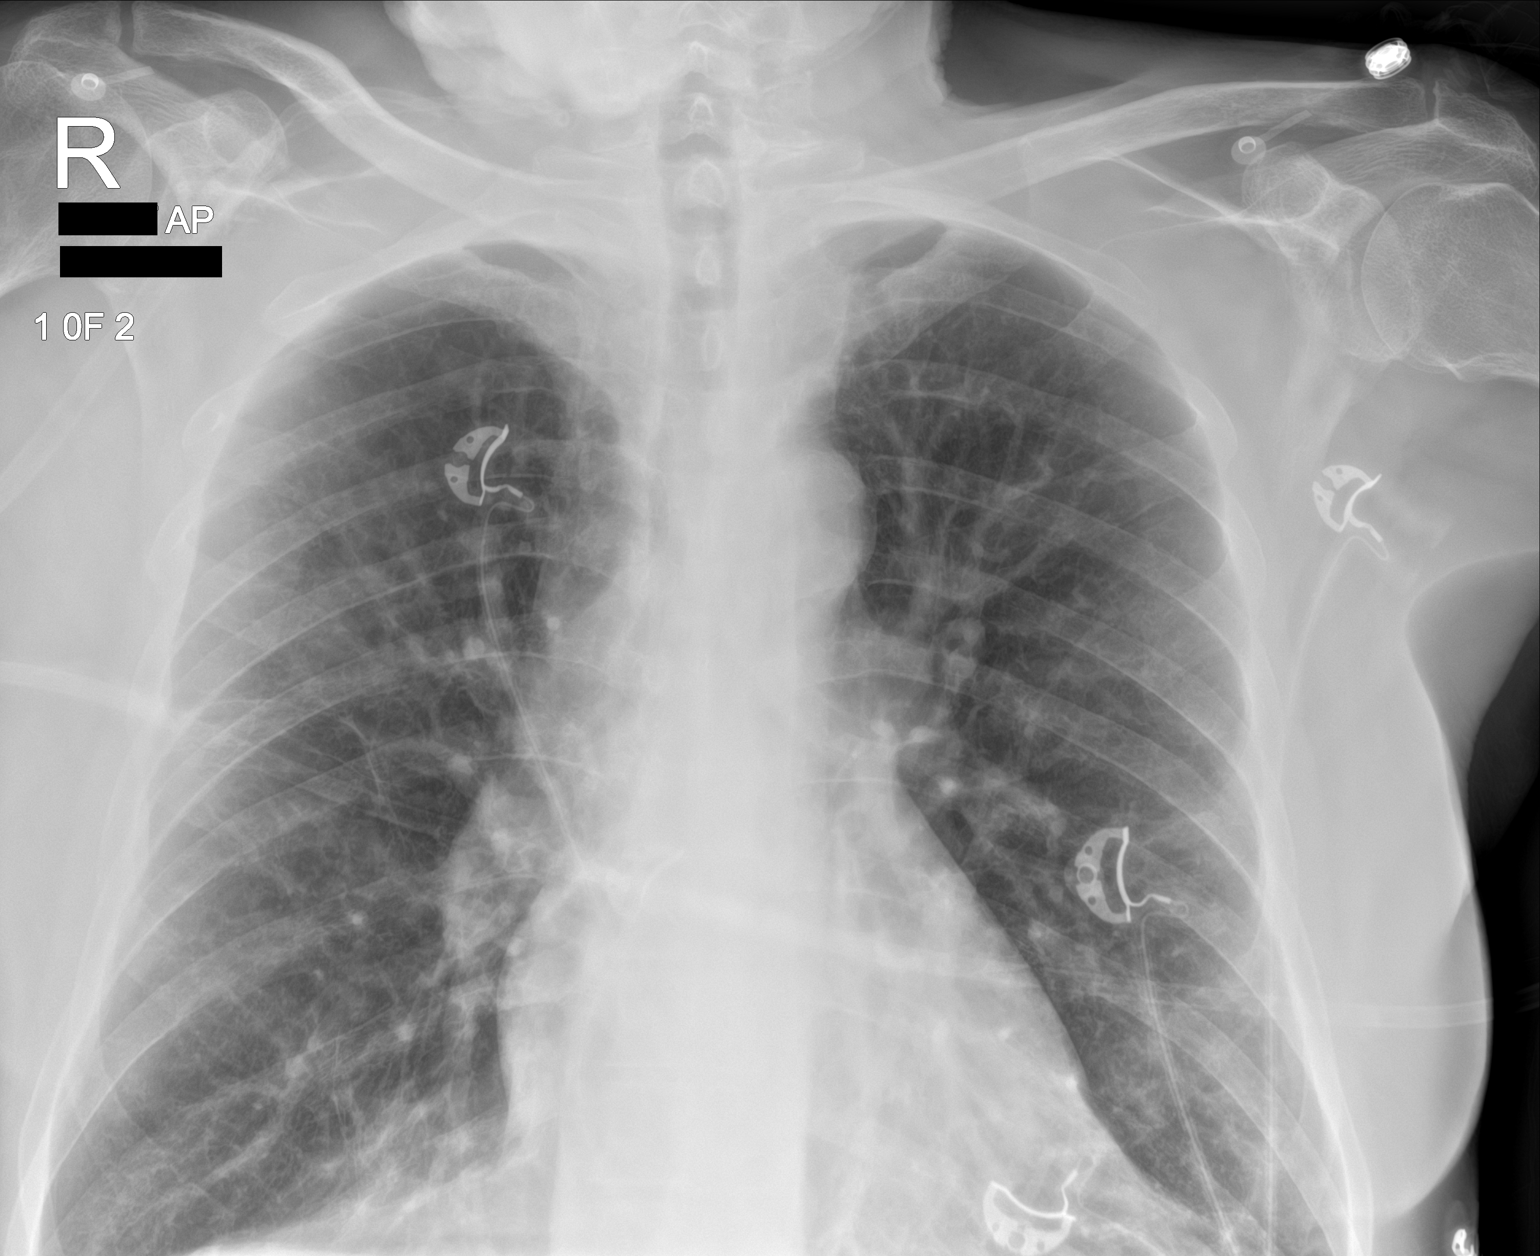

[chest ap (2 of 2)]
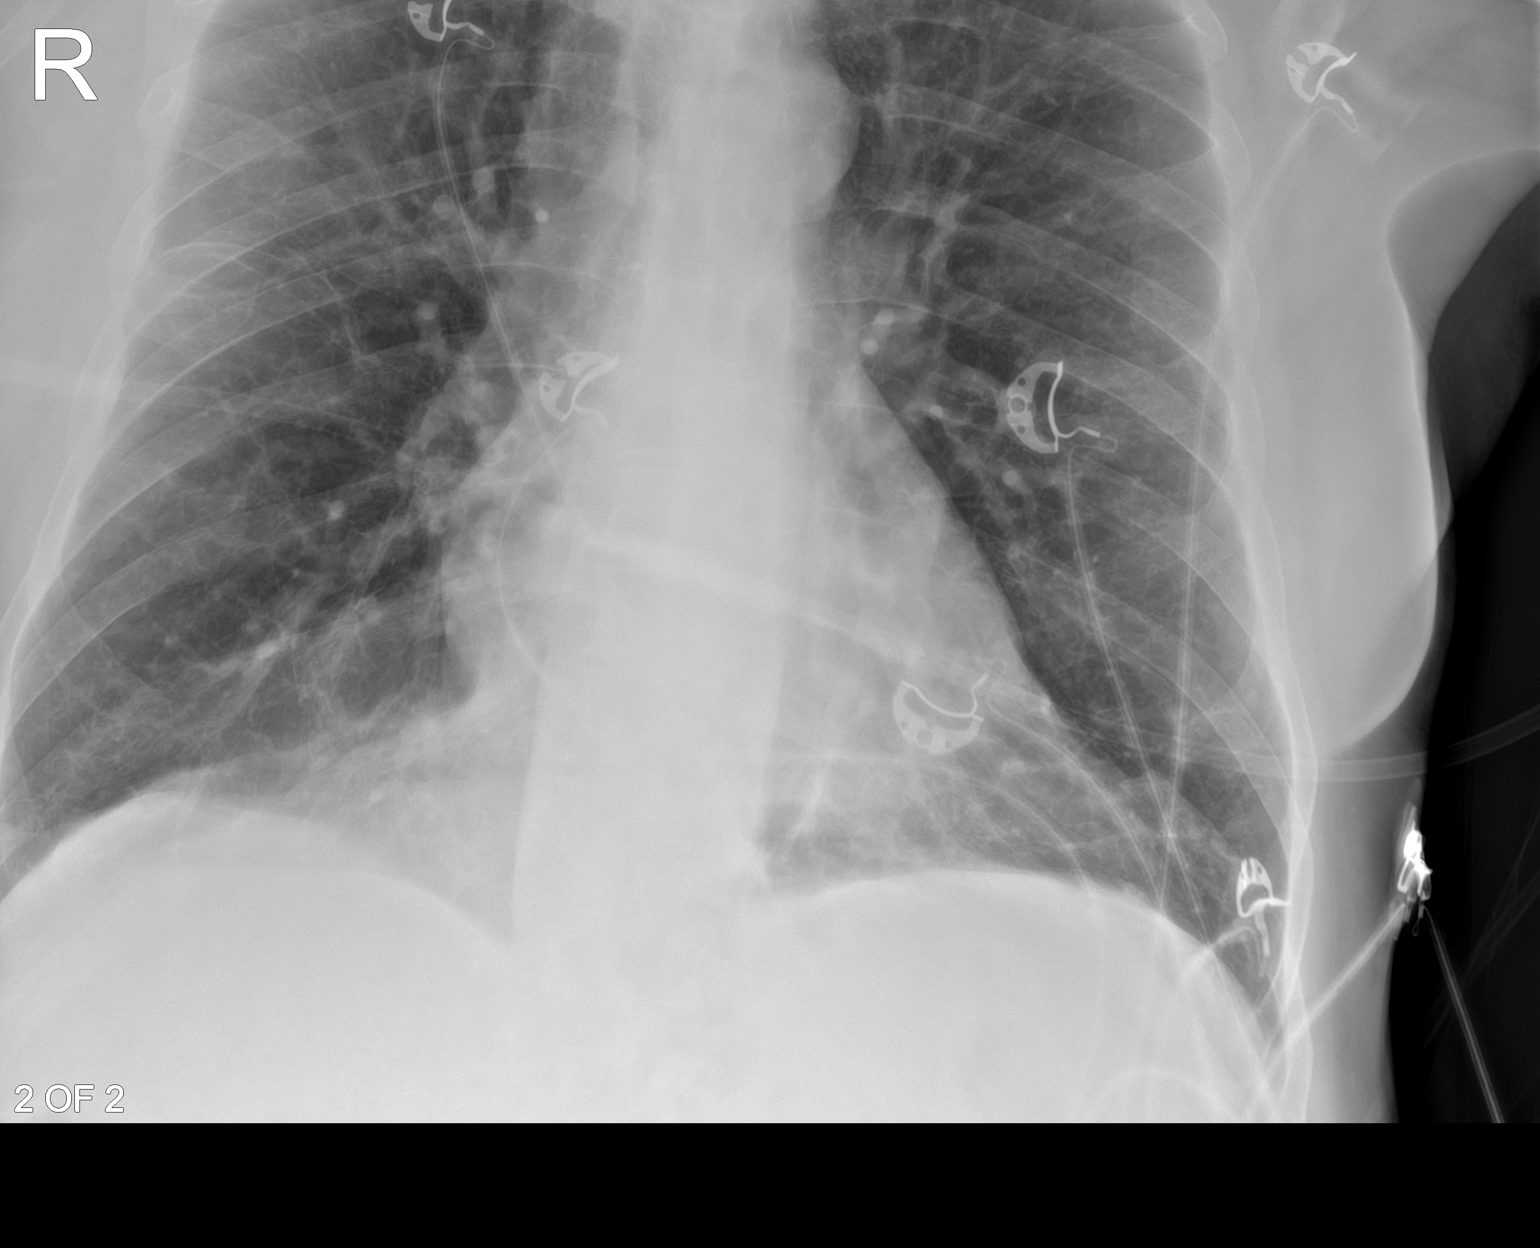

[2 of 2 positions shown; findings below may reference images not displayed]

FINDINGS: The cardiopericardial silhouette is within normal limits for size.
There is pulmonary vascular congestion without overt pulmonary
edema. Lungs are hyperexpanded bilaterally. No pleural effusion. The
visualized bony structures of the thorax are intact. Telemetry leads
overlie the chest.
IMPRESSION: 1. Pulmonary vascular congestion without overt edema or focal
pneumonia.
2. Underlying emphysema.

## 2022-01-10 DIAGNOSIS — H40013 Open angle with borderline findings, low risk, bilateral: Secondary | ICD-10-CM | POA: Diagnosis not present

## 2022-02-25 DIAGNOSIS — Z125 Encounter for screening for malignant neoplasm of prostate: Secondary | ICD-10-CM | POA: Diagnosis not present

## 2022-02-25 DIAGNOSIS — R7303 Prediabetes: Secondary | ICD-10-CM | POA: Diagnosis not present

## 2022-03-04 DIAGNOSIS — Z6829 Body mass index (BMI) 29.0-29.9, adult: Secondary | ICD-10-CM | POA: Diagnosis not present

## 2022-03-04 DIAGNOSIS — N400 Enlarged prostate without lower urinary tract symptoms: Secondary | ICD-10-CM | POA: Diagnosis not present

## 2022-03-04 DIAGNOSIS — Z125 Encounter for screening for malignant neoplasm of prostate: Secondary | ICD-10-CM | POA: Diagnosis not present

## 2022-03-04 DIAGNOSIS — J449 Chronic obstructive pulmonary disease, unspecified: Secondary | ICD-10-CM | POA: Diagnosis not present

## 2022-03-04 DIAGNOSIS — E785 Hyperlipidemia, unspecified: Secondary | ICD-10-CM | POA: Diagnosis not present

## 2022-03-04 DIAGNOSIS — R7303 Prediabetes: Secondary | ICD-10-CM | POA: Diagnosis not present

## 2022-03-04 DIAGNOSIS — E663 Overweight: Secondary | ICD-10-CM | POA: Diagnosis not present

## 2022-03-04 DIAGNOSIS — I4891 Unspecified atrial fibrillation: Secondary | ICD-10-CM | POA: Diagnosis not present

## 2022-03-31 ENCOUNTER — Ambulatory Visit (INDEPENDENT_AMBULATORY_CARE_PROVIDER_SITE_OTHER): Payer: Medicare PPO | Admitting: Urology

## 2022-03-31 ENCOUNTER — Encounter: Payer: Self-pay | Admitting: Urology

## 2022-03-31 VITALS — BP 170/79 | HR 81

## 2022-03-31 DIAGNOSIS — R3912 Poor urinary stream: Secondary | ICD-10-CM

## 2022-03-31 DIAGNOSIS — N401 Enlarged prostate with lower urinary tract symptoms: Secondary | ICD-10-CM

## 2022-03-31 DIAGNOSIS — N138 Other obstructive and reflux uropathy: Secondary | ICD-10-CM

## 2022-03-31 DIAGNOSIS — R972 Elevated prostate specific antigen [PSA]: Secondary | ICD-10-CM | POA: Diagnosis not present

## 2022-03-31 LAB — URINALYSIS, ROUTINE W REFLEX MICROSCOPIC
Bilirubin, UA: NEGATIVE
Glucose, UA: NEGATIVE
Ketones, UA: NEGATIVE
Nitrite, UA: NEGATIVE
Protein,UA: NEGATIVE
RBC, UA: NEGATIVE
Specific Gravity, UA: 1.01 (ref 1.005–1.030)
Urobilinogen, Ur: 1 mg/dL (ref 0.2–1.0)
pH, UA: 6 (ref 5.0–7.5)

## 2022-03-31 LAB — MICROSCOPIC EXAMINATION
RBC, Urine: NONE SEEN /hpf (ref 0–2)
Renal Epithel, UA: NONE SEEN /hpf

## 2022-03-31 NOTE — Progress Notes (Signed)
Subjective: 1. Elevated PSA   2. BPH with urinary obstruction   3. Weak urinary stream      Consult requested by Dr. Atilano Ina is a former patient with a history of BPH with BOO and an elevated PSA that was 4.3 in 2018 and 5.98 in 2020.  It is now 7.0.   He remains on tamsulosin bid.  His IPSS is 10 with nocturia x 2 and a reduced stream with a sensation of incomplete emptying.  He has had no hematuria or dysuria.      IPSS     Row Name 03/31/22 1300         International Prostate Symptom Score   How often have you had the sensation of not emptying your bladder? About half the time     How often have you had to urinate less than every two hours? Not at All     How often have you found you stopped and started again several times when you urinated? Less than 1 in 5 times     How often have you found it difficult to postpone urination? Less than 1 in 5 times     How often have you had a weak urinary stream? About half the time     How often have you had to strain to start urination? Not at All     How many times did you typically get up at night to urinate? 2 Times     Total IPSS Score 10       Quality of Life due to urinary symptoms   If you were to spend the rest of your life with your urinary condition just the way it is now how would you feel about that? Pleased              ROS:  Review of Systems  Respiratory:  Positive for shortness of breath.   All other systems reviewed and are negative.   No Known Allergies  Past Medical History:  Diagnosis Date   Atrial fibrillation (HCC)    BPH (benign prostatic hyperplasia)    COPD (chronic obstructive pulmonary disease) (HCC)    Hypertension     Past Surgical History:  Procedure Laterality Date   COLONOSCOPY N/A 04/13/2017   Procedure: COLONOSCOPY;  Surgeon: Malissa Hippo, MD;  [facility];  Service: Endoscopy;  Laterality: N/A;  12:00   HERNIA REPAIR     POLYPECTOMY  04/13/2017   Procedure:  POLYPECTOMY;  Surgeon: Malissa Hippo, MD;  [facility];  Service: Endoscopy;;  colon    Social History   Socioeconomic History   Marital status: Married    Spouse name: Not on file   Number of children: Not on file   Years of education: Not on file   Highest education level: Not on file  Occupational History   Not on file  Tobacco Use   Smoking status: Former    Types: Cigarettes    Quit date: 09/18/2002    Years since quitting: 19.5   Smokeless tobacco: Never  Vaping Use   Vaping Use: Never used  Substance and Sexual Activity   Alcohol use: No   Drug use: No   Sexual activity: Not on file  Other Topics Concern   Not on file  Social History Narrative   Not on file   Social Determinants of Health   Financial Resource Strain: Not on file  Food Insecurity: Not on file  Transportation Needs: Not on file  Physical Activity: Not on file  Stress: Not on file  Social Connections: Not on file  Intimate Partner Violence: Not on file    Family History  Problem Relation Age of Onset   Heart attack Mother 36   Atrial fibrillation Brother     Anti-infectives: Anti-infectives (From admission, onward)    None       Current Outpatient Medications  Medication Sig Dispense Refill   albuterol (PROVENTIL) (2.5 MG/3ML) 0.083% nebulizer solution Take 3 mLs (2.5 mg total) by nebulization every 6 (six) hours as needed for wheezing or shortness of breath. 75 mL 12   albuterol (VENTOLIN HFA) 108 (90 Base) MCG/ACT inhaler Inhale 2 puffs into the lungs every 6 (six) hours as needed for wheezing or shortness of breath. 18 g 3   aspirin EC 81 MG EC tablet Take 1 tablet (81 mg total) by mouth daily with breakfast. 120 tablet 2   diltiazem (CARTIA XT) 240 MG 24 hr capsule Take 1 capsule (240 mg total) by mouth daily. (Patient taking differently: Take 240 mg by mouth daily before breakfast. ) 30 capsule 3   lisinopril (ZESTRIL) 20 MG tablet Take 1 tablet by mouth daily as  needed (blood pressure).      tamsulosin (FLOMAX) 0.4 MG CAPS capsule Take 0.4 mg by mouth daily.      Tiotropium Bromide-Olodaterol (STIOLTO RESPIMAT) 2.5-2.5 MCG/ACT AERS Inhale 2 puffs into the lungs daily. 4 g 3   No current facility-administered medications for this visit.     Objective: Vital signs in last 24 hours: BP (!) 170/79   Pulse 81   Intake/Output from previous day: No intake/output data recorded. Intake/Output this shift: @IOTHISSHIFT @   Physical Exam Constitutional:      Appearance: Normal appearance. He is obese.  Cardiovascular:     Rate and Rhythm: Normal rate and regular rhythm.     Heart sounds: Normal heart sounds.  Pulmonary:     Effort: Pulmonary effort is normal.     Breath sounds: Normal breath sounds.  Abdominal:     Palpations: Abdomen is soft.     Hernia: A hernia (small umbilical) is present.     Comments: obese  Genitourinary:    Comments: Uncirc phallus with moderate phimosis and mild balanitis Scrotum, testes and epididymis normal. AP without lesions. NST without mass. Prostate 2.5+ benign. SV's normal.  Musculoskeletal:        General: No swelling or tenderness. Normal range of motion.  Skin:    General: Skin is warm and dry.  Neurological:     General: No focal deficit present.     Mental Status: He is alert and oriented to person, place, and time.     Lab Results:  No results found for this or any previous visit (from the past 24 hour(s)).  BMET No results for input(s): "NA", "K", "CL", "CO2", "GLUCOSE", "BUN", "CREATININE", "CALCIUM" in the last 72 hours. PT/INR No results for input(s): "LABPROT", "INR" in the last 72 hours. ABG No results for input(s): "PHART", "HCO3" in the last 72 hours.  Invalid input(s): "PCO2", "PO2" PSA results noted.   Studies/Results: No results found.   Assessment/Plan: Elevated PSA.  His PSA has been rising and has not quite doubled in 5 years.  His prostate is large and benign.  I will  get an ExoDx "liquid biospy" and if that is negative just have him return in 6 months with a PSA.  If the ExoDx is  positive, I will get him set up for a biopsy.  BPH with BOO.  He is doing well on tamsulosin bid.   No orders of the defined types were placed in this encounter.    Orders Placed This Encounter  Procedures   Urinalysis, Routine w reflex microscopic   PSA, total and free    Standing Status:   Future    Standing Expiration Date:   04/01/2023     Return in about 6 months (around 10/01/2022) for with PSA.Marland Kitchen    CC: Dr. Dwana Melena.      Bjorn Pippin 03/31/2022 [id number]

## 2022-04-01 ENCOUNTER — Other Ambulatory Visit: Payer: Self-pay

## 2022-07-11 DIAGNOSIS — I4891 Unspecified atrial fibrillation: Secondary | ICD-10-CM | POA: Diagnosis not present

## 2022-07-11 DIAGNOSIS — R7303 Prediabetes: Secondary | ICD-10-CM | POA: Diagnosis not present

## 2022-07-15 DIAGNOSIS — R7303 Prediabetes: Secondary | ICD-10-CM | POA: Diagnosis not present

## 2022-07-15 DIAGNOSIS — I4891 Unspecified atrial fibrillation: Secondary | ICD-10-CM | POA: Diagnosis not present

## 2022-07-15 DIAGNOSIS — E785 Hyperlipidemia, unspecified: Secondary | ICD-10-CM | POA: Diagnosis not present

## 2022-07-15 DIAGNOSIS — Z125 Encounter for screening for malignant neoplasm of prostate: Secondary | ICD-10-CM | POA: Diagnosis not present

## 2022-07-15 DIAGNOSIS — N400 Enlarged prostate without lower urinary tract symptoms: Secondary | ICD-10-CM | POA: Diagnosis not present

## 2022-07-15 DIAGNOSIS — J449 Chronic obstructive pulmonary disease, unspecified: Secondary | ICD-10-CM | POA: Diagnosis not present

## 2022-07-15 DIAGNOSIS — Z0000 Encounter for general adult medical examination without abnormal findings: Secondary | ICD-10-CM | POA: Diagnosis not present

## 2022-09-13 ENCOUNTER — Ambulatory Visit (HOSPITAL_COMMUNITY)
Admission: RE | Admit: 2022-09-13 | Discharge: 2022-09-13 | Disposition: A | Discharge status: Home / Self Care | Payer: Medicare PPO | Source: Ambulatory Visit | Attending: Family Medicine | Admitting: Family Medicine

## 2022-09-13 ENCOUNTER — Other Ambulatory Visit (HOSPITAL_COMMUNITY): Payer: Self-pay | Admitting: Family Medicine

## 2022-09-13 DIAGNOSIS — J069 Acute upper respiratory infection, unspecified: Secondary | ICD-10-CM | POA: Insufficient documentation

## 2022-09-13 DIAGNOSIS — J9811 Atelectasis: Secondary | ICD-10-CM | POA: Diagnosis not present

## 2022-09-13 DIAGNOSIS — Z87891 Personal history of nicotine dependence: Secondary | ICD-10-CM | POA: Diagnosis not present

## 2022-09-13 DIAGNOSIS — I4891 Unspecified atrial fibrillation: Secondary | ICD-10-CM | POA: Diagnosis not present

## 2022-09-13 DIAGNOSIS — R059 Cough, unspecified: Secondary | ICD-10-CM | POA: Diagnosis not present

## 2022-09-13 DIAGNOSIS — R0602 Shortness of breath: Secondary | ICD-10-CM | POA: Diagnosis not present

## 2022-09-23 DIAGNOSIS — R600 Localized edema: Secondary | ICD-10-CM | POA: Diagnosis not present

## 2022-09-23 DIAGNOSIS — J449 Chronic obstructive pulmonary disease, unspecified: Secondary | ICD-10-CM | POA: Diagnosis not present

## 2022-09-23 DIAGNOSIS — I482 Chronic atrial fibrillation, unspecified: Secondary | ICD-10-CM | POA: Diagnosis not present

## 2022-10-04 DIAGNOSIS — H40013 Open angle with borderline findings, low risk, bilateral: Secondary | ICD-10-CM | POA: Diagnosis not present

## 2022-10-20 NOTE — Progress Notes (Signed)
CARDIOLOGY CONSULT NOTE       Patient ID: Cole [name] MRN: [id number] DOB/AGE: 1946-04-20 77 y.o.  Admit date: (Not on file) Referring Physician: [name] Primary Physician: [name], MD Primary Cardiologist: New Reason for Consultation: AFib  Active Problems:   * No active hospital problems. *   HPI:  77 y.o. referred by Dr [name] for chronic afib.  History of afib, HTN, BPH and COPD first diagnosed with Dr [name] in 2018 Not been followed by cardiology since and it appears that at some point rate control and anticoagulation strategy adopted TTE 01/28/20 with EF 55-60% no valved dx with LA size 31 mm   He is retired [employer] stopped smoking 18 years ago but has significant COPD with oxygen at home he only wears on occasion He felt poorly when primary increased cardizem to 300 mg and wants to stay on 240 mg He says he bruises and is a "bleeder" and has not started his eliquis. Discussed risk of stroke and told him he could stop ASA.  He gets rare palpitations and does not think he has much PAF He is in NSR today   ROS All other systems reviewed and negative except as noted above  Past Medical History:  Diagnosis Date   Atrial fibrillation (HCC)    BPH (benign prostatic hyperplasia)    COPD (chronic obstructive pulmonary disease) (HCC)    Hypertension     Family History  Problem Relation Age of Onset   Heart attack Mother 80   Atrial fibrillation Brother     Social History   Socioeconomic History   Marital status: Married    Spouse name: Not on file   Number of children: Not on file   Years of education: Not on file   Highest education level: Not on file  Occupational History   Not on file  Tobacco Use   Smoking status: Former    Types: Cigarettes    Quit date: 09/18/2002    Years since quitting: 20.1   Smokeless tobacco: Never  Vaping Use   Vaping Use: Never used  Substance and Sexual Activity   Alcohol use: No   Drug use: No   Sexual activity: Not on file  Other Topics  Concern   Not on file  Social History Narrative   Not on file   Social Determinants of Health   Financial Resource Strain: Not on file  Food Insecurity: Not on file  Transportation Needs: Not on file  Physical Activity: Not on file  Stress: Not on file  Social Connections: Not on file  Intimate Partner Violence: Not on file    Past Surgical History:  Procedure Laterality Date   COLONOSCOPY N/A 04/13/2017   Procedure: COLONOSCOPY;  Surgeon: [name], MD;  Location: [facility];  Service: Endoscopy;  Laterality: N/A;  12:00   HERNIA REPAIR     POLYPECTOMY  04/13/2017   Procedure: POLYPECTOMY;  Surgeon: [name], MD;  Location: [facility];  Service: Endoscopy;;  colon      Current Outpatient Medications:    albuterol (PROVENTIL) (2.5 MG/3ML) 0.083% nebulizer solution, Take 3 mLs (2.5 mg total) by nebulization every 6 (six) hours as needed for wheezing or shortness of breath., Disp: 75 mL, Rfl: 12   albuterol (VENTOLIN HFA) 108 (90 Base) MCG/ACT inhaler, Inhale 2 puffs into the lungs every 6 (six) hours as needed for wheezing or shortness of breath., Disp: 18 g, Rfl: 3   aspirin EC 81  MG EC tablet, Take 1 tablet (81 mg total) by mouth daily with breakfast., Disp: 120 tablet, Rfl: 2   diltiazem (CARTIA XT) 240 MG 24 hr capsule, Take 1 capsule (240 mg total) by mouth daily. (Patient taking differently: Take 240 mg by mouth daily before breakfast. ), Disp: 30 capsule, Rfl: 3   lisinopril (ZESTRIL) 20 MG tablet, Take 1 tablet by mouth daily as needed (blood pressure). , Disp: , Rfl:    tamsulosin (FLOMAX) 0.4 MG CAPS capsule, Take 0.4 mg by mouth daily. , Disp: , Rfl:    Tiotropium Bromide-Olodaterol (STIOLTO RESPIMAT) 2.5-2.5 MCG/ACT AERS, Inhale 2 puffs into the lungs daily., Disp: 4 g, Rfl: 3    Physical Exam: There were no vitals taken for this visit.  Affect appropriate Healthy:  appears stated age 11: normal Neck supple with no adenopathy JVP normal no  bruits no thyromegaly Lungs clear with no wheezing and good diaphragmatic motion Heart:  S1/S2 no murmur, no rub, gallop or click PMI normal Abdomen: benighn, BS positve, no tenderness, no AAA no bruit.  No HSM or HJR Distal pulses intact with no bruits No edema Neuro non-focal Skin warm and dry No muscular weakness   Labs:   Lab Results  Component Value Date   WBC 8.7 01/26/2020   HGB 14.5 01/26/2020   HCT 44.9 01/26/2020   MCV 86.2 01/26/2020   PLT 210 01/26/2020   No results for input(s): "NA", "K", "CL", "CO2", "BUN", "CREATININE", "CALCIUM", "PROT", "BILITOT", "ALKPHOS", "ALT", "AST", "GLUCOSE" in the last 168 hours.  Invalid input(s): "LABALBU" Lab Results  Component Value Date   CKTOTAL 114 10/05/2011   CKMB 3.2 10/05/2011   TROPONINI <0.03 12/28/2016    Lab Results  Component Value Date   CHOL 173 09/26/2007   Lab Results  Component Value Date   HDL 34 (L) 09/26/2007   Lab Results  Component Value Date   LDLCALC 115 (H) 09/26/2007   Lab Results  Component Value Date   TRIG 118 09/26/2007   Lab Results  Component Value Date   CHOLHDL 5.1 Ratio 09/26/2007   No results found for: "LDLDIRECT"    Radiology: No results found.  EKG: 01/28/20  SR rate 72 normal    ASSESSMENT AND PLAN:   PaF:  first diagnosed 2018 lost to f/u May have infrequent PAF now 30 day monitor to see Update TTE given dyspnea he will start eliquis now for stroke prevention  COPD:  f/u primary mild exp wheezing on exam CXR with ? Chronic ILD 09/13/22 At discretion of primary refer to pulmonary and consider non contrast high resolution CT HTN  continue ACE, diuretic and cardizem  BPH  f/u [facility] urology on Flomax   TTE 30 day monitor F/U pulmonary for COPD   F/U in 6 months   Signed: [name] 10/28/2022, 11:04 AM

## 2022-10-28 ENCOUNTER — Encounter: Payer: Self-pay | Admitting: Cardiovascular Disease

## 2022-10-28 ENCOUNTER — Ambulatory Visit: Payer: Medicare PPO | Attending: Cardiovascular Disease | Admitting: Cardiovascular Disease

## 2022-10-28 VITALS — BP 158/60 | HR 82 | Ht 66.0 in | Wt 209.2 lb

## 2022-10-28 DIAGNOSIS — I480 Paroxysmal atrial fibrillation: Secondary | ICD-10-CM

## 2022-10-28 DIAGNOSIS — I4891 Unspecified atrial fibrillation: Secondary | ICD-10-CM | POA: Diagnosis not present

## 2022-10-28 DIAGNOSIS — R0600 Dyspnea, unspecified: Secondary | ICD-10-CM | POA: Diagnosis not present

## 2022-10-28 NOTE — Patient Instructions (Signed)
Medication Instructions:  Your physician recommends that you continue on your current medications as directed. Please refer to the Current Medication list given to you today.  *If you need a refill on your cardiac medications before your next appointment, please call your pharmacy*   Lab Work: NONE   If you have labs (blood work) drawn today and your tests are completely normal, you will receive your results only by: [facility] (if you have MyChart) OR A paper copy in the mail If you have any lab test that is abnormal or we need to change your treatment, we will call you to review the results.   Testing/Procedures: Your physician has recommended that you wear an event monitor. Event monitors are medical devices that record the heart's electrical activity. Doctors most often [place] these monitors to diagnose arrhythmias. Arrhythmias are problems with the speed or rhythm of the heartbeat. The monitor is a small, portable device. You can wear one while you do your normal daily activities. This is usually used to diagnose what is causing palpitations/syncope (passing out).  Your physician has requested that you have an echocardiogram. Echocardiography is a painless test that uses sound waves to create images of your heart. It provides your doctor with information about the size and shape of your heart and how well your heart's chambers and valves are working. This procedure takes approximately one hour. There are no restrictions for this procedure. Please do NOT wear cologne, perfume, aftershave, or lotions (deodorant is allowed). Please arrive 15 minutes prior to your appointment time.    Follow-Up: At [facility], you and your health needs are our priority.  As part of our continuing mission to provide you with exceptional heart care, we have created designated Provider Care Teams.  These Care Teams include your primary Cardiologist (physician) and Advanced Practice Providers (APPs  -  Physician Assistants and Nurse Practitioners) who all work together to provide you with the care you need, when you need it.  We recommend signing up for the patient portal called "MyChart".  Sign up information is provided on this After Visit Summary.  MyChart is used to connect with patients for Virtual Visits (Telemedicine).  Patients are able to view lab/test results, encounter notes, upcoming appointments, etc.  Non-urgent messages can be sent to your provider as well.   To learn more about what you can do with MyChart, go to [email or web address].    Your next appointment:   6 month(s)  Provider:   [name], MD    Other Instructions Thank you for choosing [facility]!

## 2022-11-03 ENCOUNTER — Ambulatory Visit: Payer: Medicare PPO | Admitting: Internal Medicine

## 2022-11-03 ENCOUNTER — Encounter: Payer: Self-pay | Admitting: Internal Medicine

## 2022-11-03 VITALS — BP 136/68 | HR 86 | Ht 66.0 in | Wt 209.0 lb

## 2022-11-03 DIAGNOSIS — J449 Chronic obstructive pulmonary disease, unspecified: Secondary | ICD-10-CM

## 2022-11-03 MED ORDER — PANTOPRAZOLE SODIUM 40 MG PO TBEC: 40.0000 mg | DELAYED_RELEASE_TABLET | Freq: Every day | ORAL | 2 refills | Status: DC

## 2022-11-03 MED ORDER — METHYLPREDNISOLONE ACETATE 80 MG/ML IJ SUSP
80.0000 mg | Freq: Once | INTRAMUSCULAR | Status: AC
  Administered 2022-11-03: 80 mg via INTRAMUSCULAR

## 2022-11-03 MED ORDER — FAMOTIDINE 20 MG PO TABS: ORAL_TABLET | ORAL | 11 refills | Status: DC

## 2022-11-03 MED ORDER — STIOLTO RESPIMAT 2.5-2.5 MCG/ACT IN AERS: 2.0000 | INHALATION_SPRAY | Freq: Every day | RESPIRATORY_TRACT | 0 refills | Status: DC

## 2022-11-03 NOTE — Progress Notes (Signed)
Cole [name], male    DOB: [birth year]    MRN: [id number]   Brief patient profile:  62  yowm quit smoking 2004 with doe  referred to pulmonary clinic in [place]  11/03/2022 by Dr [name]  for sob / cough x 2020    Afib x around 2019    History of Present Illness  11/03/2022  Pulmonary/ 1st office eval/ [place] / [name]  Chief Complaint  Patient presents with   Consult    COPD/ SOB/ Cough   Dyspnea:  last grocery store shopping 2lpm POC  Cough: worse at  hs min greenish after vigorous throat clearing  Sleep: sleeps on floor due to back/ 2 pillows  SABA use: neb 4 x daily not timed to take pre-exertion  02: only with exertion  none at hs   No obvious day to day or daytime pattern/variability or  mucus plugs or hemoptysis or cp or chest tightness, subjective wheeze or overt sinus or hb symptoms.     Also denies any obvious fluctuation of symptoms with weather or environmental changes or other aggravating or alleviating factors except as outlined above   No unusual exposure hx or h/o childhood pna/ asthma or knowledge of premature birth.  Current Allergies, Complete Past Medical History, Past Surgical History, Family History, and Social History were reviewed in [employer] record.  ROS  The following are not active complaints unless bolded Hoarseness, sore throat/globus , dysphagia, dental problems, itching, sneezing,  nasal congestion or discharge of excess mucus or purulent secretions, ear ache,   fever, chills, sweats, unintended wt loss or wt gain, classically pleuritic or exertional cp,  orthopnea pnd or arm/hand swelling  or leg swelling, presyncope, palpitations, abdominal pain, anorexia, nausea, vomiting, diarrhea  or change in bowel habits or change in bladder habits, change in stools or change in urine, dysuria, hematuria,  rash, arthralgias, visual complaints, headache, numbness, weakness or ataxia or problems with walking or coordination,  change in  mood or  memory.             Past Medical History:  Diagnosis Date   Atrial fibrillation (HCC)    BPH (benign prostatic hyperplasia)    COPD (chronic obstructive pulmonary disease) (HCC)    Hypertension     Outpatient Medications Prior to Visit  Medication Sig Dispense Refill   albuterol (PROVENTIL) (2.5 MG/3ML) 0.083% nebulizer solution Take 3 mLs (2.5 mg total) by nebulization every 6 (six) hours as needed for wheezing or shortness of breath. 75 mL 12   albuterol (VENTOLIN HFA) 108 (90 Base) MCG/ACT inhaler Inhale 2 puffs into the lungs every 6 (six) hours as needed for wheezing or shortness of breath. 18 g 3   aspirin EC 81 MG EC tablet Take 1 tablet (81 mg total) by mouth daily with breakfast. 120 tablet 2   COMBIVENT RESPIMAT 20-100 MCG/ACT AERS respimat Inhale 1 puff into the lungs 2 (two) times daily.     diltiazem (CARTIA XT) 240 MG 24 hr capsule Take 1 capsule (240 mg total) by mouth daily. (Patient taking differently: Take 240 mg by mouth daily before breakfast.) 30 capsule 3   furosemide (LASIX) 20 MG tablet Take 20 mg by mouth daily as needed.     tamsulosin (FLOMAX) 0.4 MG CAPS capsule Take 0.4 mg by mouth daily.             Tiotropium Bromide-Olodaterol (STIOLTO RESPIMAT) 2.5-2.5 MCG/ACT AERS Inhale 2 puffs into the lungs daily. 4 g  3       Objective:     BP 136/68   Pulse 86   Ht 5' 6"$  (1.676 m)   Wt 209 lb (94.8 kg)   SpO2 (!) 88% Comment: ra  BMI 33.73 kg/m   SpO2: (!) 88 % (ra)  Amb wm with freq throat clearing  HEENT :  Oropharynx  clear/ no upper teeth      NECK :  without JVD/Nodes/TM/ nl carotid upstrokes bilaterally   LUNGS: no acc muscle use,  Mod barrel  contour chest wall with bilateral  Distant bs s audible wheeze and  without cough on insp or exp maneuvers and mod  Hyperresonant  to  percussion bilaterally     CV:  RRR  no s3 or murmur or increase in P2, and trace pitting both LEs  ABD:  pot betty contour / soft and nontender with pos  mid insp Hoover's  in the supine position. No bruits or organomegaly appreciated, bowel sounds nl  MS:   Ext warm without deformities or   obvious joint restrictions , calf tenderness, cyanosis or clubbing  SKIN: warm and dry without lesions    NEURO:  alert, approp, nl sensorium with  no motor or cerebellar deficits apparent.          I personally reviewed images and agree with radiology impression as follows:  CXR:   pa and lateral 11/03/22 Chronic appearing increased interstitial lung markings with mild right basilar linear atelectasis.     Assessment   COPD GOLD ?  group B Quit smoking 2004 with doe at that point and downhill ever since  - 11/03/2022  After extensive coaching inhaler device,  effectiveness =    80% with smi > start stiolto and more approp saba  - 11/03/2022   Walked on RA  x  2  lap(s) =  approx 300  ft  @ mod fast pace, stopped due to sob with lowest 02 sats 92%    When respiratory symptoms begin or become refractory well after a patient reports complete smoking cessation,  especially when this wasn't the case while they were smoking, a red flag is raised based on the work of Dr Kris Mouton which states:  if you quit smoking when your best day FEV1 is still well preserved it is highly unlikely you will progress to severe disease which appears to be the case here, at least on initial impression   That is to say, once the smoking stops,  the symptoms should not suddenly erupt or markedly worsen.  If so, the differential diagnosis should include  obesity/deconditioning,  LPR/Reflux/Aspiration syndromes,  occult CHF, or  especially side effects of medications commonly used in this population.    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   Of the three most common causes of  Sub-acute / recurrent or chronic cough, only one (GERD)  can actually contribute to/ trigger  the other two (asthma and post nasal drip syndrome)  and perpetuate the cylce of  cough.  While not intuitively obvious, many patients with chronic low grade reflux do not cough until there is a primary insult that disturbs the protective epithelial barrier and exposes sensitive nerve endings.   This is typically viral but can due to PNDS and  either may apply here.   The point is that once this occurs, it is difficult to eliminate the cycle  using anything but a maximally effective acid suppression regimen  at least in the short run, accompanied by an appropriate diet to address non acid GERD and control / eliminate the cough to the extent possible with hard rock candy and max doses of mucinex (can't take DM per cards)   2) CHF > cards w/u in progress ? Element of Cor pulmonale to be determined  3) Obesity/ deconditioning - baseline wt 190 and only 209 now but all centripetal where it may interfere the most with diaphragm function and contribute to both occult and obvious GERD  4) adverse side effects = ACEi now off, would continue to avoid any meds that confuse the interpretation of his non-specific symptoms here to include amiodarone and non-selective Beta blockers if at all possible    >>> try paced walking (with 02 sat monitoring with goal > 90%) , max gerd rx, more approp use of saba (pre-ex, not post) and free stiolto trial while on max gerd rx >>> also  added  Depomedrol 120 mg IM  in case of component of Th-2 driven upper or lower airways inflammation (if cough responds short term only to relapse before return while will on full rx for uacs (as above), then  that would point to allergic rhinitis/ asthma or eos bronchitis as alternative dx) and f/u in 4 weeks   Each maintenance medication was reviewed in detail including emphasizing most importantly the difference between maintenance and prns and under what circumstances the prns are to be triggered using an action plan format where appropriate.  Total time for H and P, chart review, counseling, reviewing hfa/neb/smi/02  device(s) , directly observing portions of ambulatory 02 saturation study/ and generating customized AVS unique to this office visit / same day charting  > 60 min new pt eval.                   Christinia Gully, MD 11/03/2022

## 2022-11-03 NOTE — Patient Instructions (Addendum)
Plan A = Automatic = Always=    Stiolto 2 puffs 1st thing each am and Pantoprazole (protonix) 40 mg   Take  30-60 min before first meal of the day and Pepcid (famotidine)  20 mg after supper until return to office - this is the best way to tell whether stomach acid is contributing to your problem.    Work on inhaler technique:  relax and gently blow all the way out then take a nice smooth full deep breath back in, triggering the inhaler at same time you start breathing in.  Hold breath in for at least  5 seconds if you can.   Rinse and gargle with water when done.        Plan B = Backup (to supplement plan A, not to replace it) Only use your albuterol inhaler as a rescue medication to be used if you can't catch your breath by resting or doing a relaxed purse lip breathing pattern or walking at a slow pace - The less you use it, the better it will work when you need it. - Ok to use the inhaler up to 2 puffs  every 4 hours if you must but call for appointment if use goes up over your usual need - Don't leave home without it !!  (think of it like the spare tire for your car)   Plan C = Crisis (instead of Plan B but only if Plan B stops working) - only use your albuterol-ipatropium nebulizer if you first try Plan B and it fails to help > ok to use the nebulizer up to every 4 hours but if start needing it regularly call for immediate appointment   Also  Ok to try albuterol 15 min before an activity (on alternating days)  that you know would usually make you short of breath and see if it makes any difference and if makes none then don't take albuterol after activity unless you can't catch your breath as this means it's the resting that helps, not the albuterol.     Depemedrol 120 mg IM   Cough/ congestion >  400  mg up every 4 hours  - 1200 mg every 12 hours   GERD (REFLUX)  is an extremely common cause of respiratory symptoms just like yours , many times with no obvious heartburn at all.    It can  be treated with medication, but also with lifestyle changes including elevation of the head of your bed (ideally with 6 -8inch blocks under the headboard of your bed),  Smoking cessation, avoidance of late meals, excessive alcohol, and avoid fatty foods, chocolate, peppermint, colas, red wine, and acidic juices such as orange juice.  NO MINT OR MENTHOL PRODUCTS SO NO COUGH DROPS  USE SUGARLESS CANDY INSTEAD (Jolley ranchers or Stover's or Life Savers) or even ice chips will also do - the key is to swallow to prevent all throat clearing. NO OIL BASED VITAMINS - use powdered substitutes.  Avoid fish oil when coughing.   Make sure you check your oxygen saturation  AT  your highest level of activity (not after you stop)   to be sure it stays over 90% and adjust  02 flow upward to maintain this level if needed but remember to turn it back to previous settings when you stop (to conserve your supply).   Please schedule a follow up office visit in 4 weeks, sooner if needed  with all medications /inhalers/ solutions in hand so we can  verify exactly what you are taking. This includes all medications from all doctors and over the counters

## 2022-11-03 NOTE — Assessment & Plan Note (Addendum)
Quit smoking 2004 with doe at that point and downhill ever since  - 11/03/2022  After extensive coaching inhaler device,  effectiveness =    80% with smi > start stiolto and more approp saba  - 11/03/2022   Walked on RA  x  2  lap(s) =  approx 300  ft  @ mod fast pace, stopped due to sob with lowest 02 sats 92%    When respiratory symptoms begin or become refractory well after a patient reports complete smoking cessation,  especially when this wasn't the case while they were smoking, a red flag is raised based on the work of Dr Kris Mouton which states:  if you quit smoking when your best day FEV1 is still well preserved it is highly unlikely you will progress to severe disease which appears to be the case here, at least on initial impression   That is to say, once the smoking stops,  the symptoms should not suddenly erupt or markedly worsen.  If so, the differential diagnosis should include  obesity/deconditioning,  LPR/Reflux/Aspiration syndromes,  occult CHF, or  especially side effects of medications commonly used in this population.    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   Of the three most common causes of  Sub-acute / recurrent or chronic cough, only one (GERD)  can actually contribute to/ trigger  the other two (asthma and post nasal drip syndrome)  and perpetuate the cylce of cough.  While not intuitively obvious, many patients with chronic low grade reflux do not cough until there is a primary insult that disturbs the protective epithelial barrier and exposes sensitive nerve endings.   This is typically viral but can due to PNDS and  either may apply here.   The point is that once this occurs, it is difficult to eliminate the cycle  using anything but a maximally effective acid suppression regimen at least in the short run, accompanied by an appropriate diet to address non acid GERD and control / eliminate the cough to the extent possible with hard rock candy and  max doses of mucinex (can't take DM per cards)   2) CHF > cards w/u in progress ? Element of Cor pulmonale to be determined  3) Obesity/ deconditioning - baseline wt 190 and only 209 now but all centripetal where it may interfere the most with diaphragm function and contribute to both occult and obvious GERD  4) adverse side effects = ACEi now off, would continue to avoid any meds that confuse the interpretation of his non-specific symptoms here to include amiodarone and non-selective Beta blockers if at all possible    >>> try paced walking (with 02 sat monitoring with goal > 90%) , max gerd rx, more approp use of saba (pre-ex, not post) and free stiolto trial while on max gerd rx >>> also  added  Depomedrol 120 mg IM  in case of component of Th-2 driven upper or lower airways inflammation (if cough responds short term only to relapse before return while will on full rx for uacs (as above), then  that would point to allergic rhinitis/ asthma or eos bronchitis as alternative dx) and f/u in 4 weeks   Each maintenance medication was reviewed in detail including emphasizing most importantly the difference between maintenance and prns and under what circumstances the prns are to be triggered using an action plan format where appropriate.  Total time for H and P, chart review, counseling, reviewing  hfa/neb/smi/02 device(s) , directly observing portions of ambulatory 02 saturation study/ and generating customized AVS unique to this office visit / same day charting  > 60 min new pt eval.

## 2022-11-03 NOTE — Addendum Note (Signed)
Addended by: Fritzi Mandes D on: 11/03/2022 03:09 PM   Modules accepted: Orders

## 2022-11-04 ENCOUNTER — Telehealth: Payer: Self-pay | Admitting: *Deleted

## 2022-11-04 ENCOUNTER — Ambulatory Visit: Payer: Medicare PPO

## 2022-11-04 DIAGNOSIS — I480 Paroxysmal atrial fibrillation: Secondary | ICD-10-CM | POA: Diagnosis not present

## 2022-11-04 NOTE — Telephone Encounter (Signed)
Pt was instructed to contact PCP because we do not prescribe either medication to him. Nothing further needed.

## 2022-11-04 NOTE — Telephone Encounter (Signed)
seen yesterday by wert. patient wants to know if he needs to take his blood thinner eloquist or aspirin. Please call and advise patient

## 2022-11-16 DIAGNOSIS — R7303 Prediabetes: Secondary | ICD-10-CM | POA: Diagnosis not present

## 2022-11-16 DIAGNOSIS — I4891 Unspecified atrial fibrillation: Secondary | ICD-10-CM | POA: Diagnosis not present

## 2022-11-17 ENCOUNTER — Telehealth: Payer: Self-pay | Admitting: *Deleted

## 2022-11-17 NOTE — Telephone Encounter (Signed)
Received a fax from [facility] stating that they have not received a transmission in 2 days. Called to notify pt. He was not home. Spoke with Yosniel Spragg (wife) who states she will let pt know. Number given to [facility].

## 2022-11-18 ENCOUNTER — Telehealth: Payer: Self-pay | Admitting: Cardiovascular Disease

## 2022-11-18 NOTE — Telephone Encounter (Signed)
Patient is requesting provider switch from Dr. Johnsie Cancel to Dr. [name]. Patient's wife states patient has seen Dr. [name] years ago in the hospital and would really like to see him in the future.

## 2022-11-22 DIAGNOSIS — Z79899 Other long term (current) drug therapy: Secondary | ICD-10-CM | POA: Diagnosis not present

## 2022-11-22 DIAGNOSIS — R7303 Prediabetes: Secondary | ICD-10-CM | POA: Diagnosis not present

## 2022-11-22 DIAGNOSIS — R600 Localized edema: Secondary | ICD-10-CM | POA: Diagnosis not present

## 2022-11-22 DIAGNOSIS — J449 Chronic obstructive pulmonary disease, unspecified: Secondary | ICD-10-CM | POA: Diagnosis not present

## 2022-11-22 DIAGNOSIS — I482 Chronic atrial fibrillation, unspecified: Secondary | ICD-10-CM | POA: Diagnosis not present

## 2022-11-22 DIAGNOSIS — K219 Gastro-esophageal reflux disease without esophagitis: Secondary | ICD-10-CM | POA: Diagnosis not present

## 2022-11-22 DIAGNOSIS — E785 Hyperlipidemia, unspecified: Secondary | ICD-10-CM | POA: Diagnosis not present

## 2022-11-22 DIAGNOSIS — N400 Enlarged prostate without lower urinary tract symptoms: Secondary | ICD-10-CM | POA: Diagnosis not present

## 2022-11-22 DIAGNOSIS — Z87891 Personal history of nicotine dependence: Secondary | ICD-10-CM | POA: Diagnosis not present

## 2022-11-23 ENCOUNTER — Encounter: Payer: Self-pay | Admitting: Internal Medicine

## 2022-11-29 ENCOUNTER — Ambulatory Visit (HOSPITAL_COMMUNITY)
Admission: RE | Admit: 2022-11-29 | Discharge: 2022-11-29 | Disposition: A | Discharge status: Home / Self Care | Payer: Medicare PPO | Source: Ambulatory Visit | Attending: Cardiovascular Disease | Admitting: Cardiovascular Disease

## 2022-11-29 DIAGNOSIS — R0600 Dyspnea, unspecified: Secondary | ICD-10-CM | POA: Diagnosis not present

## 2022-11-29 DIAGNOSIS — R0609 Other forms of dyspnea: Secondary | ICD-10-CM

## 2022-11-29 LAB — ECHOCARDIOGRAM COMPLETE
AR max vel: 1.68 cm2
AV Area VTI: 2.12 cm2
AV Area mean vel: 1.86 cm2
AV Mean grad: 9 mmHg
AV Peak grad: 17.6 mmHg
Ao pk vel: 2.1 m/s
Area-P 1/2: 2.33 cm2
Calc EF: 62.8 %
MV VTI: 2.69 cm2
S' Lateral: 3.7 cm
Single Plane A2C EF: 56 %
Single Plane A4C EF: 66.1 %

## 2022-11-29 NOTE — Progress Notes (Signed)
  Echocardiogram 2D Echocardiogram has been performed.  Cole [name] 11/29/2022, 3:39 PM

## 2022-12-04 NOTE — Progress Notes (Unsigned)
Cole [name], male    DOB: 1946-02-22    MRN: [id number]   Brief patient profile:  27  yowm quit smoking 2004 with doe  referred to pulmonary clinic in [place]  11/03/2022 by Dr [name]  for sob / cough x 2020    Afib x around 2019    History of Present Illness  11/03/2022  Pulmonary/ 1st office eval/ Cole [name] / [name]  Chief Complaint  Patient presents with   Consult    COPD/ SOB/ Cough   Dyspnea:  last grocery store shopping 2lpm POC  Cough: worse at  hs min greenish after vigorous throat clearing  Sleep: sleeps on floor due to back/ 2 pillows  SABA use: neb 4 x daily not timed to take pre-exertion  02: only with exertion  none at hs  Rec Plan A = Automatic = Always=    Stiolto 2 puffs 1st thing each am and Pantoprazole (protonix) 40 mg   Take  30-60 min before first meal of the day and Pepcid (famotidine)  20 mg after supper until return to office  Work on inhaler technique:   Plan B = Backup (to supplement plan A, not to replace it) Only use your albuterol inhaler as a rescue medication Plan C = Crisis (instead of Plan B but only if Plan B stops working) - only use your albuterol-ipatropium nebulizer if you first try [place] to try albuterol 15 min before an activity (on alternating days)  that you know would usually make you short of breath and see if it makes any difference and if makes none then don't take albuterol after activity unless you can't catch your breath as this means it's the resting that helps, not the albuterol. Depomedrol 120 mg IM  Cough/ congestion >  400  mg up every 4 hours  - 1200 mg every 12 hours  GERD diet reviewed, bed blocks rec  Make sure you check your oxygen saturation  AT  your highest level of activity (not after you stop)   to be sure it stays over 90%  Please schedule a follow up office visit in 4 weeks    12/05/2022  f/u ov/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   Sleeping  without nocturnal  or early am exacerbation  of respiratory  c/o's or need for noct saba. Also denies any obvious fluctuation of symptoms with weather or environmental changes or other aggravating or alleviating factors except as outlined above   No unusual exposure hx or h/o childhood pna/ asthma or knowledge of premature birth.  Current Allergies, Complete Past Medical History, Past Surgical History, Family History, and Social History were reviewed in [employer] record.  ROS  The following are not active complaints unless bolded Hoarseness, sore throat, dysphagia, dental problems, itching, sneezing,  nasal  congestion or discharge of excess mucus or purulent secretions, ear ache,   fever, chills, sweats, unintended wt loss or wt gain, classically pleuritic or exertional cp,  orthopnea pnd or arm/hand swelling  or leg swelling, presyncope, palpitations, abdominal pain, anorexia, nausea, vomiting, diarrhea  or change in bowel habits or change in bladder habits, change in stools or change in urine, dysuria, hematuria,  rash, arthralgias, visual complaints, headache, numbness, weakness or ataxia or problems with walking or coordination,  change in mood or  memory.        Current Meds  Medication Sig   albuterol (PROVENTIL) (2.5 MG/3ML) 0.083% nebulizer solution Take 3 mLs (2.5 mg total) by nebulization every 6 (six) hours as needed for wheezing or shortness of breath.   albuterol  (VENTOLIN HFA) 108 (90 Base) MCG/ACT inhaler Inhale 2 puffs into the lungs every 6 (six) hours as needed for wheezing or shortness of breath.   diltiazem (CARTIA XT) 240 MG 24 hr capsule Take 1 capsule (240 mg total) by mouth daily. (Patient taking differently: Take 240 mg by mouth daily before breakfast.)   ELIQUIS 5 MG TABS tablet Take 5 mg by mouth 2 (two) times daily.   famotidine (PEPCID) 20 MG tablet One after supper   furosemide (LASIX) 20 MG tablet Take 20 mg by mouth daily as needed.   pantoprazole (PROTONIX) 40 MG tablet Take 1 tablet (40 mg total) by mouth daily. Take 30-60 min before first meal of the day   tamsulosin (FLOMAX) 0.4 MG CAPS capsule Take 0.4 mg by mouth daily.    Tiotropium Bromide-Olodaterol (STIOLTO RESPIMAT) 2.5-2.5 MCG/ACT AERS Inhale 2 puffs into the lungs daily.             Past Medical History:  Diagnosis Date   Atrial fibrillation (HCC)    BPH (benign prostatic hyperplasia)    COPD (chronic obstructive pulmonary disease) (HCC)    Hypertension        Objective:     Wt Readings from Last 3 Encounters:  12/05/22 204 lb 6.4 oz (92.7 kg)  11/03/22 209 lb (94.8 kg)  10/28/22 209 lb 3.2 oz (94.9 kg)      Vital signs reviewed  12/05/2022  - Note at rest 02 sats  90% on RA   General appearance:    pleasant wm nad/ all smiles     HEENT : Oropharynx  clear      NECK :  without  apparent JVD/ palpable Nodes/TM    LUNGS: no acc muscle use,  Mild barrel  contour chest wall with bilateral  Distant bs s audible wheeze and  without cough on insp or exp maneuvers  and mild  Hyperresonant  to  percussion bilaterally     CV:  RRR  no s3 or murmur or increase in P2, and trace pitting both ankles   ABD:  soft and nontender with pos end  insp Hoover's  in the supine position.  No bruits or organomegaly appreciated   MS:  Nl gait/ ext warm without deformities Or obvious joint restrictions  calf tenderness, cyanosis or clubbing     SKIN: warm and dry  without lesions    NEURO:  alert, approp, nl sensorium with  no motor or cerebellar deficits apparent.            I personally reviewed images and agree with radiology impression as follows:  CXR:   pa and lateral 11/03/22 Chronic appearing increased interstitial lung markings with mild right basilar linear  atelectasis.     Assessment

## 2022-12-05 ENCOUNTER — Encounter: Payer: Self-pay | Admitting: Internal Medicine

## 2022-12-05 ENCOUNTER — Ambulatory Visit: Payer: Medicare PPO | Admitting: Internal Medicine

## 2022-12-05 VITALS — BP 138/63 | HR 76 | Ht 70.0 in | Wt 204.4 lb

## 2022-12-05 DIAGNOSIS — J449 Chronic obstructive pulmonary disease, unspecified: Secondary | ICD-10-CM | POA: Diagnosis not present

## 2022-12-05 MED ORDER — STIOLTO RESPIMAT 2.5-2.5 MCG/ACT IN AERS: 2.0000 | INHALATION_SPRAY | Freq: Every day | RESPIRATORY_TRACT | 0 refills | Status: DC

## 2022-12-05 MED ORDER — STIOLTO RESPIMAT 2.5-2.5 MCG/ACT IN AERS: 2.0000 | INHALATION_SPRAY | Freq: Every day | RESPIRATORY_TRACT | 11 refills | Status: DC

## 2022-12-05 NOTE — Assessment & Plan Note (Signed)
Quit smoking 2004 with doe at that point and downhill ever since  - 11/03/2022  After extensive coaching inhaler device,  effectiveness =    80% with smi > start stiolto and more approp saba  - 11/03/2022   Walked on RA  x  2  lap(s) =  approx 300  ft  @ mod pace, stopped due to sob with lowest 02 sats 92%     Marked clinical improvement on stiolto 2 each am to where he doesn't feel he needs much saba and no 02 c/w Group B copd ? Gold stage > needs PFts next  In meantime classic Pt is Group B in terms of symptom/risk and laba/lama therefore appropriate rx at this point >>>  continue stiolto and monitor sats as well as saba need  Re SABA :  I spent extra time with pt today reviewing appropriate use of albuterol for prn use on exertion with the following points: 1) saba is for relief of sob that does not improve by walking a slower pace or resting but rather if the pt does not improve after trying this first. 2) If the pt is convinced, as many are, that saba helps recover from activity faster then it's easy to tell if this is the case by re-challenging : ie stop, take the inhaler, then p 5 minutes try the exact same activity (intensity of workload) that just caused the symptoms and see if they are substantially diminished or not after saba 3) if there is an activity that reproducibly causes the symptoms, try the saba 15 min before the activity on alternate days   If in fact the saba really does help, then fine to continue to use it prn but advised may need to look closer at the maintenance regimen being used to achieve better control of airways disease with exertion.           Each maintenance medication was reviewed in detail including emphasizing most importantly the difference between maintenance and prns and under what circumstances the prns are to be triggered using an action plan format where appropriate.  Total time for H and P, chart review, counseling, reviewing smi/hfa/neb/02  device(s)  and generating customized AVS unique to this office visit / same day charting = 20 min

## 2022-12-05 NOTE — Patient Instructions (Signed)
Plan A = Automatic = Always=    continue stiolto 2 puffs each am   Plan B = Backup (to supplement plan A, not to replace it) Only use your albuterol inhaler as a rescue medication to be used if you can't catch your breath by resting or doing a relaxed purse lip breathing pattern.  - The less you use it, the better it will work when you need it. - Ok to use the inhaler up to 2 puffs  every 4 hours if you must but call for appointment if use goes up over your usual need - Don't leave home without it !!  (think of it like the spare tire for your car)   Plan C = Crisis (instead of Plan B but only if Plan B stops working) - only use your albuterol nebulizer if you first try Plan B and it fails to help > ok to use the nebulizer up to every 4 hours but if start needing it regularly call for immediate appointment  Also  Ok to try albuterol 15 min before an activity (on alternating days)  that you know would usually make you short of breath and see if it makes any difference and if makes none then don't take albuterol after activity unless you can't catch your breath as this means it's the resting that helps, not the albuterol.      Please schedule a follow up visit in 3 months but call sooner if needed with pfts

## 2022-12-13 ENCOUNTER — Other Ambulatory Visit: Payer: Self-pay

## 2022-12-13 ENCOUNTER — Ambulatory Visit: Payer: Medicare PPO | Attending: Cardiovascular Disease

## 2022-12-13 DIAGNOSIS — I480 Paroxysmal atrial fibrillation: Secondary | ICD-10-CM

## 2023-03-07 ENCOUNTER — Ambulatory Visit: Payer: Medicare PPO | Admitting: Internal Medicine

## 2023-03-09 ENCOUNTER — Ambulatory Visit (INDEPENDENT_AMBULATORY_CARE_PROVIDER_SITE_OTHER): Payer: Medicare PPO | Admitting: Internal Medicine

## 2023-03-09 ENCOUNTER — Encounter: Payer: Self-pay | Admitting: Internal Medicine

## 2023-03-09 VITALS — BP 158/71 | HR 78 | Ht 68.0 in | Wt 207.0 lb

## 2023-03-09 DIAGNOSIS — J449 Chronic obstructive pulmonary disease, unspecified: Secondary | ICD-10-CM

## 2023-03-09 DIAGNOSIS — R0902 Hypoxemia: Secondary | ICD-10-CM

## 2023-03-09 NOTE — Patient Instructions (Addendum)
For cough / congestion > mucinex or mucinex dm up to 1200 mg every 12 hours   Work on inhaler technique:  relax and gently blow all the way out then take a nice smooth full deep breath back in, triggering the inhaler at same time you start breathing in.  Hold breath in for at least  5 seconds if you can.   Rinse and gargle with water when done.  If mouth or throat bother you at all,  try brushing teeth/gums/tongue with arm and hammer toothpaste/ make a slurry and gargle and spit out.       Ok to try albuterol 15 min before an activity (on alternating days)  that you know would usually make you short of breath and see if it makes any difference and if makes none then don't take albuterol after activity unless you can't catch your breath as this means it's the resting that helps, not the albuterol.   Make sure you check your oxygen saturation  AT  your highest level of activity (not after you stop)   to be sure it stays over 90% and adjust  02 flow upward to maintain this level if needed but remember to turn it back to previous settings when you stop (to conserve your supply).   Your constipation may be due to the cardizem but discus with prescriber  Stiolto may also be contributing and may need to reduce to one pff daily   Please schedule a follow up office visit in 6 weeks, call sooner if needed with all medications /inhalers/ solutions in hand so we can verify exactly what you are taking. This includes all medications from all doctors and over the counters

## 2023-03-09 NOTE — Progress Notes (Signed)
Cole [name], male    DOB: 1946-08-04    MRN: [id number]   Brief patient profile:  41  yowm quit smoking 2004 with doe  referred to pulmonary clinic in [place]  11/03/2022 by Dr Cole [name]  for sob / cough x 2020    Afib x around 2019    History of Present Illness  11/03/2022  Pulmonary/ 1st office eval/ Cole [name] / [name]  Chief Complaint  Patient presents with   Consult    COPD/ SOB/ Cough   Dyspnea:  last grocery store shopping 2lpm POC  Cough: worse at  hs min greenish after vigorous throat clearing  Sleep: sleeps on floor due to back/ 2 pillows  SABA use: neb 4 x daily not timed to take pre-exertion  02: only with exertion  none at hs  Rec Plan A = Automatic = Always=    Stiolto 2 puffs 1st thing each am and Pantoprazole (protonix) 40 mg   Take  30-60 min before first meal of the day and Pepcid (famotidine)  20 mg after supper until return to office  Work on inhaler technique:   Plan B = Backup (to supplement plan A, not to replace it) Only use your albuterol inhaler as a rescue medication Plan C = Crisis (instead of Plan B but only if Plan B stops working) - only use your albuterol-ipatropium nebulizer if you first try Plan B Also  Ok to try albuterol 15 min before an activity (on alternating days)  that you know would usually make you short of breath and see if it makes any difference and if makes none then don't take albuterol after activity unless you can't catch your breath as this means it's the resting that helps, not the albuterol. 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  maint on stiolto  2 each am  Chief Complaint  Patient  presents with   Follow-up    Pt f/u for COPD he states that he is doing a lot better on the stiolto inhaler  Dyspnea:  not checking walking but doing better walking mb and back  Cough: better  Sleeping: on floor  SABA use: every few days when overdoes it > hfa, no neb any more  02: not wearing  Rec Plan A = Automatic = Always=    continue stiolto 2 puffs each am Plan B = Backup (to supplement plan A, not to replace it) Only use your albuterol inhaler as a rescue medication Plan C = Crisis (instead of Plan B but only if Plan B stops working) - only use your albuterol nebulizer if you first try Plan B  Also  Ok to try albuterol 15 min before an activity (on alternating days)  that you know would usually make you short of breath      Please schedule a follow up visit in 3 months but call sooner if needed with pfts > not done as of  03/09/2023     03/09/2023  f/u ov/[place] office/Cole [name] re: GOLD group A  maint on stiolto  Chief Complaint  Patient presents with   Follow-up   Dyspnea:  house to mb is incline up to mb not pre treating or checking sats as rec  Cough: clear this am / typically worse first thing am  Sleeping: on floor with 2 pillows  SABA use: twice a day  02: has inogen and typically uses p supper at rest s checking 02 sats     No obvious day to day or daytime variability or assoc excess/ purulent sputum or mucus plugs or hemoptysis or cp or chest tightness, subjective wheeze or overt sinus or hb symptoms.     Also denies any obvious fluctuation of symptoms with weather or environmental changes or other aggravating or alleviating factors except as outlined above   No unusual exposure hx or h/o childhood pna/ asthma or knowledge of premature birth.  Current Allergies, Complete Past Medical History, Past Surgical History, Family History, and Social History were reviewed in [employer] record.  ROS  The following are not active complaints unless  bolded Hoarseness, sore throat, dysphagia, dental problems, itching, sneezing,  nasal congestion or discharge of excess mucus or purulent secretions, ear ache,   fever, chills, sweats, unintended wt loss or wt gain, classically pleuritic or exertional cp,  orthopnea pnd or arm/hand swelling  or leg swelling, presyncope, palpitations, abdominal pain, anorexia, nausea, vomiting, diarrhea  or change in bowel habits=constipation  or change in bladder habits, change in stools or change in urine, dysuria, hematuria,  rash, arthralgias, visual complaints, headache, numbness, weakness or ataxia or problems with walking or coordination,  change in mood or  memory.        Current Meds  Medication Sig   albuterol (PROVENTIL) (2.5 MG/3ML) 0.083% nebulizer solution Take 3 mLs (2.5 mg total) by nebulization every 6 (six) hours as needed for wheezing or shortness of breath.   albuterol (VENTOLIN HFA) 108 (90 Base) MCG/ACT inhaler Inhale 2 puffs into the lungs every 6 (six) hours as needed for wheezing or shortness of breath.   diltiazem (CARTIA XT) 240 MG 24 hr capsule Take 1 capsule (240 mg total) by mouth daily. (Patient taking differently: Take 240 mg by mouth daily before breakfast.)   ELIQUIS 5 MG TABS tablet Take 5 mg by mouth 2 (two) times daily.   famotidine (PEPCID) 20 MG tablet One after supper   furosemide (LASIX) 20 MG tablet Take 20 mg by mouth daily as needed.   pantoprazole (PROTONIX) 40 MG tablet Take 1 tablet (40 mg total) by mouth daily. Take 30-60 min before first meal of the day   tamsulosin (FLOMAX) 0.4 MG CAPS capsule Take 0.4 mg by mouth daily.    Tiotropium Bromide-Olodaterol (STIOLTO RESPIMAT) 2.5-2.5 MCG/ACT AERS Inhale 2 puffs into the lungs daily.   [DISCONTINUED] Tiotropium Bromide-Olodaterol (STIOLTO RESPIMAT) 2.5-2.5 MCG/ACT AERS Inhale 2 puffs into the lungs daily.            Past Medical History:  Diagnosis Date   Atrial fibrillation (HCC)    BPH (benign prostatic  hyperplasia)    COPD (chronic obstructive pulmonary disease) (HCC)    Hypertension        Objective:    Wts   03/09/2023       207   12/05/22 204 lb 6.4 oz (92.7 kg)  11/03/22 209 lb (94.8 kg)  10/28/22 209 lb 3.2 oz (94.9 kg)     Vital signs reviewed  03/09/2023  - Note at rest 02 sats  91% on RA  General appearance:    elderly amb  wm nad      HEENT : Oropharynx  clear     NECK :  without  apparent JVD/ palpable Nodes/TM    LUNGS: no acc muscle use,  Mild barrel  contour chest wall with bilateral  Distant bs s audible wheeze and  without cough on insp or exp maneuvers  and mild  Hyperresonant  to  percussion bilaterally     CV:  RRR  no s3 or murmur or increase in P2, and trace pitting both ankles   ABD: Pot belly/  soft and nontender with pos half dollar sized easily reducible UH     SKIN: warm and dry without lesions    NEURO:  alert, approp, nl sensorium with  no motor or cerebellar deficits apparent.          Assessment

## 2023-03-10 ENCOUNTER — Encounter: Payer: Self-pay | Admitting: Internal Medicine

## 2023-03-10 DIAGNOSIS — R0902 Hypoxemia: Secondary | ICD-10-CM | POA: Insufficient documentation

## 2023-03-10 NOTE — Assessment & Plan Note (Addendum)
Quit smoking 2004 with doe at that point and downhill ever since  - 11/03/2022  After extensive coaching inhaler device,  effectiveness =    80% with smi > start stiolto and more approp saba  - 11/03/2022   Walked on RA  x  2  lap(s) =  approx 300  ft  @ mod pace, stopped due to sob with lowest 02 sats 92%  - 03/09/2023  After extensive coaching inhaler device,  effectiveness =    60% (not triggering on insp/ short Ti/ not holding breath  - 03/09/2023 reduce stiolto to one puff each am to see if helps urination   Pt is Group B in terms of symptom/risk and laba/lama therefore appropriate rx at this point >>>  stiolto best choice by may be affecting bladder outlet obst/constipation  rec   For cough / congestion > mucinex or mucinex dm up to 1200 mg every 12 hours   Work on inhaler technique:  relax and gently blow all the way out then take a nice smooth full deep breath back in, triggering the inhaler at same time you start breathing in.  Hold breath in for at least  5 seconds if you can.   Rinse and gargle with water when done.  If mouth or throat bother you at all,  try brushing teeth/gums/tongue with arm and hammer toothpaste/ make a slurry and gargle and spit out.       Ok to try albuterol 15 min before an activity (on alternating days)  that you know would usually make you short of breath and see if it makes any difference and if makes none then don't take albuterol after activity unless you can't catch your breath as this means it's the resting that helps, not the albuterol.   Your constipation   may be due to the cardizem but discus with prescriber  Stiolto may also be contributing  to constipation and decreased urine flow and  so need to reduce to one pff daily to see if helps   Please schedule a follow up office visit in 6 weeks, call sooner if needed with all medications /inhalers/ solutions in hand so we can verify exactly what you are taking. This includes all medications from all doctors  and over the counters

## 2023-03-10 NOTE — Assessment & Plan Note (Signed)
03/09/2023   Walked on 3  x  3  lap(s) =  approx 450  ft  @ moderated pace, stopped due to end of study with lowest 02 sats 84% min sob   Rec Make sure you check your oxygen saturation at your highest level of activity(NOT after you stop)  to be sure it stays over 90% and keep track of it at least once a week, more often if breathing getting worse, and let me know if losing ground. (Collect the dots to connect the dots approach)    Each maintenance medication was reviewed in detail including emphasizing most importantly the difference between maintenance and prns and under what circumstances the prns are to be triggered using an action plan format where appropriate.  Total time for H and P, chart review, counseling, reviewing  02 device(s) , directly observing portions of ambulatory 02 saturation study/ and generating customized AVS unique to this office visit / same day charting  > 30 min

## 2023-04-19 NOTE — Progress Notes (Deleted)
Cole [name], male    DOB: May 01, 1946    MRN: [id number]  Brief patient profile:  89  yowm quit smoking 2004 with doe  referred to pulmonary clinic in [place]  11/03/2022 by Dr Cole [name]  for sob / cough x 2020    Afib x around 2019    History of Present Illness  11/03/2022  Pulmonary/ 1st office eval/ Cole [name] / [name]  Chief Complaint  Patient presents with   Consult    COPD/ SOB/ Cough   Dyspnea:  last grocery store shopping 2lpm POC  Cough: worse at  hs min greenish after vigorous throat clearing  Sleep: sleeps on floor due to back/ 2 pillows  SABA use: neb 4 x daily not timed to take pre-exertion  02: only with exertion  none at hs  Rec Plan A = Automatic = Always=    Stiolto 2 puffs 1st thing each am and Pantoprazole (protonix) 40 mg   Take  30-60 min before first meal of the day and Pepcid (famotidine)  20 mg after supper until return to office  Work on inhaler technique:   Plan B = Backup (to supplement plan A, not to replace it) Only use your albuterol inhaler as a rescue medication Plan C = Crisis (instead of Plan B but only if Plan B stops working) - only use your albuterol-ipatropium nebulizer if you first try Plan B Also  Ok to try albuterol 15 min before an activity (on alternating days)  that you know would usually make you short of breath and see if it makes any difference and if makes none then don't take albuterol after activity unless you can't catch your breath as this means it's the resting that helps, not the albuterol. Depomedrol 120 mg IM  Cough/ congestion >  400  mg up every 4 hours  - 1200 mg every 12 hours  GERD diet reviewed, bed blocks rec  Make sure you check your oxygen saturation  AT  your highest level of activity (not after you stop)   to be sure it stays over 90%  Please schedule a follow up office visit in 4 weeks    12/05/2022  f/u ov/Cole [name] office/Cole [name] re: copd gold ?/Group B  maint on stiolto  2 each am  Chief Complaint  Patient  presents with   Follow-up    Pt f/u for COPD he states that he is doing a lot better on the stiolto inhaler  Dyspnea:  not checking walking but doing better walking mb and back  Cough: better  Sleeping: on floor  SABA use: every few days when overdoes it > hfa, no neb any more  02: not wearing  Rec Plan A = Automatic = Always=    continue stiolto 2 puffs each am Plan B = Backup (to supplement plan A, not to replace it) Only use your albuterol inhaler as a rescue medication Plan C = Crisis (instead of Plan B but only if Plan B stops working) - only use your albuterol nebulizer if you first try Plan B  Also  Ok to try albuterol 15 min before an activity (on alternating days)  that you know would usually make you short of breath      Please schedule a follow up visit in 3 months but call sooner if needed with pfts > not done as of  03/09/2023     03/09/2023  f/u ov/Cole [name] office/Cole [name] re: GOLD group A  maint on stiolto  Chief Complaint  Patient presents with   Follow-up   Dyspnea:  house to mb is incline up to mb not pre treating or checking sats as rec  Cough: clear this am / typically worse first thing am  Sleeping: on floor with 2 pillows  SABA use: twice a day  02: has inogen and typically uses p supper at rest s checking 02 sats Rec For cough / congestion > mucinex or mucinex dm up to 1200 mg every 12 hours  Work on inhaler technique:    Ok to try albuterol 15 min before an activity (on alternating days)  that you know would usually make you short of breath  Make sure you check your oxygen saturation  AT  your highest level of activity (not after you stop)   to be sure it stays over 90%  Your constipation may be due to the cardizem but discus with prescriber Stiolto may also be contributing and may need to reduce to one pff daily  Please schedule a follow up office visit in 6 weeks, call sooner if needed with all medications /inhalers/ solutions in hand    04/20/2023  f/u  ov/Cole [name] office/Cole [name] re: GOLD group A maint on *** did *** bring meds  No chief complaint on file.   Dyspnea:  *** Cough: *** Sleeping: *** SABA use: *** 02: *** Covid status: *** Lung cancer screening: ***   No obvious day to day or daytime variability or assoc excess/ purulent sputum or mucus plugs or hemoptysis or cp or chest tightness, subjective wheeze or overt sinus or hb symptoms.   *** without nocturnal  or early am exacerbation  of respiratory  c/o's or need for noct saba. Also denies any obvious fluctuation of symptoms with weather or environmental changes or other aggravating or alleviating factors except as outlined above   No unusual exposure hx or h/o childhood pna/ asthma or knowledge of premature birth.  Current Allergies, Complete Past Medical History, Past Surgical History, Family History, and Social History were reviewed in [employer] record.  ROS  The following are not active complaints unless bolded Hoarseness, sore throat, dysphagia, dental problems, itching, sneezing,  nasal congestion or discharge of excess mucus or purulent secretions, ear ache,   fever, chills, sweats, unintended wt loss or wt gain, classically pleuritic or exertional cp,  orthopnea pnd or arm/hand swelling  or leg swelling, presyncope, palpitations, abdominal pain, anorexia, nausea, vomiting, diarrhea  or change in bowel habits or change in bladder habits, change in stools or change in urine, dysuria, hematuria,  rash, arthralgias, visual complaints, headache, numbness, weakness or ataxia or problems with walking or coordination,  change in mood or  memory.        No outpatient medications have been marked as taking for the 04/20/23 encounter (Appointment) with Cole Cowden, Cole [name].            Past Medical History:  Diagnosis Date   Atrial fibrillation [facility])    BPH (benign prostatic hyperplasia)    COPD (chronic obstructive pulmonary disease) (HCC)    Hypertension         Objective:    Wts  04/20/2023          ***  03/09/2023       207   12/05/22 204 lb 6.4 oz (92.7 kg)  11/03/22 209 lb (94.8 kg)  10/28/22 209 lb 3.2 oz (94.9 kg)    Vital signs reviewed  04/20/2023  - Note at  rest 02 sats  ***% on ***   General appearance:    ***     Mild barrel  trace pitting both ankles    pos half dollar sized easily reducible UH***           Assessment

## 2023-04-20 ENCOUNTER — Ambulatory Visit: Payer: Medicare PPO | Admitting: Internal Medicine

## 2023-05-10 NOTE — Progress Notes (Unsigned)
Cole [name], male    DOB: 1946-07-16    MRN: [id number]  Brief patient profile:  43  yowm quit smoking 2004 with doe  referred to pulmonary clinic in [place]  11/03/2022 by Dr Margo Aye  for sob / cough x 2020    Afib x around 2019   History of Present Illness  11/03/2022  Pulmonary/ 1st office eval/ Carolyn Maniscalco / [facility]  Chief Complaint  Patient presents with   Consult    COPD/ SOB/ Cough   Dyspnea:  last grocery store shopping 2lpm POC  Cough: worse at  hs min greenish after vigorous throat clearing  Sleep: sleeps on floor due to back/ 2 pillows  SABA use: neb 4 x daily not timed to take pre-exertion  02: only with exertion  none at hs  Rec Plan A = Automatic = Always=    Stiolto 2 puffs 1st thing each am and Pantoprazole (protonix) 40 mg   Take  30-60 min before first meal of the day and Pepcid (famotidine)  20 mg after supper until return to office  Work on inhaler technique:   Plan B = Backup (to supplement plan A, not to replace it) Only use your albuterol inhaler as a rescue medication Plan C = Crisis (instead of Plan B but only if Plan B stops working) - only use your albuterol-ipatropium nebulizer if you first try Plan B Also  Ok to try albuterol 15 min before an activity (on alternating days)  that you know would usually make you short of breath and see if it makes any difference and if makes none then don't take albuterol after activity unless you can't catch your breath as this means it's the resting that helps, not the albuterol. Depomedrol 120 mg IM  Cough/ congestion >  400  mg up every 4 hours  - 1200 mg every 12 hours  GERD diet reviewed, bed blocks rec  Make sure you check your oxygen saturation  AT  your highest level of activity (not after you stop)   to be sure it stays over 90%  Please schedule a follow up office visit in 4 weeks    12/05/2022  f/u ov/[place] office/Kaitlynne Wenz re: copd gold ?/Group B  maint on stiolto  2 each am  Chief Complaint  Patient presents  with   Follow-up    Pt f/u for COPD he states that he is doing a lot better on the stiolto inhaler  Dyspnea:  not checking walking but doing better walking mb and back  Cough: better  Sleeping: on floor  SABA use: every few days when overdoes it > hfa, no neb any more  02: not wearing  Rec Plan A = Automatic = Always=    continue stiolto 2 puffs each am Plan B = Backup (to supplement plan A, not to replace it) Only use your albuterol inhaler as a rescue medication Plan C = Crisis (instead of Plan B but only if Plan B stops working) - only use your albuterol nebulizer if you first try Plan B  Also  Ok to try albuterol 15 min before an activity (on alternating days)  that you know would usually make you short of breath      Please schedule a follow up visit in 3 months but call sooner if needed with pfts > not done as of  03/09/2023    03/09/2023  f/u ov/ office/Calee Nugent re: GOLD group A  maint on stiolto   Chief Complaint  Patient presents with   Follow-up   Dyspnea: house to mb is incline up to mb not pre treating or checking sats as rec  Cough: clear this am / typically worse first thing am  Sleeping: on floor with 2 pillows  SABA use: twice a day  02: has inogen and typically uses p supper at rest s checking 02 sats Rec For cough / congestion > mucinex or mucinex dm up to 1200 mg every 12 hours  Work on inhaler technique:    Ok to try albuterol 15 min before an activity (on alternating days)  that you know would usually make you short of breath  Make sure you check your oxygen saturation  AT  your highest level of activity (not after you stop)   to be sure it stays over 90%  Your constipation may be due to the cardizem but discus with prescriber Stiolto may also be contributing and may need to reduce to one pff daily  Please schedule a follow up office visit in 6 weeks, call sooner if needed with all medications /inhalers/ solutions in hand    05/11/2023  f/u ov/[place]  office/Taven Strite re: GOLD group A maint on stioto did  bring meds  Chief Complaint  Patient presents with   COPD   Dyspnea:  weed eating in early am/ too hot to go MB Cough: very little  Sleeping: on floor x years x 2  SABA use: once or twice daily if outside in heat  02: uses inogen prn p ex     No obvious day to day or daytime variability or assoc excess/ purulent sputum or mucus plugs or hemoptysis or cp or chest tightness, subjective wheeze or overt sinus or hb symptoms.    Also denies any obvious fluctuation of symptoms with weather or environmental changes or other aggravating or alleviating factors except as outlined above   No unusual exposure hx or h/o childhood pna/ asthma or knowledge of premature birth.  Current Allergies, Complete Past Medical History, Past Surgical History, Family History, and Social History were reviewed in [employer] record.  ROS  The following are not active complaints unless bolded Hoarseness, sore throat, dysphagia, dental problems, itching, sneezing,  nasal congestion or discharge of excess mucus or purulent secretions, ear ache,   fever, chills, sweats, unintended wt loss or wt gain, classically pleuritic or exertional cp,  orthopnea pnd or arm/hand swelling  or leg swelling, presyncope, palpitations, abdominal pain, anorexia, nausea, vomiting, diarrhea  or change in bowel habits or change in bladder habits, change in stools or change in urine, dysuria, hematuria,  rash, arthralgias, visual complaints, headache, numbness, weakness or ataxia or problems with walking or coordination,  change in mood or  memory.        Current Meds  Medication Sig   albuterol (PROVENTIL) (2.5 MG/3ML) 0.083% nebulizer solution Take 3 mLs (2.5 mg total) by nebulization every 6 (six) hours as needed for wheezing or shortness of breath.   albuterol (VENTOLIN HFA) 108 (90 Base) MCG/ACT inhaler Inhale 2 puffs into the lungs every 6 (six) hours as needed for  wheezing or shortness of breath.   DILT-XR 240 MG 24 hr capsule Take 240 mg by mouth daily.   ELIQUIS 5 MG TABS tablet Take 5 mg by mouth 2 (two) times daily.   famotidine (PEPCID) 20 MG tablet One after supper   guaiFENesin (MUCINEX) 600 MG 12 hr tablet Take by mouth 2 (two) times daily.   ipratropium-albuterol (  DUONEB) 0.5-2.5 (3) MG/3ML SOLN Take 3 mLs by nebulization.   pantoprazole (PROTONIX) 40 MG tablet Take 1 tablet (40 mg total) by mouth daily. Take 30-60 min before first meal of the day   tamsulosin (FLOMAX) 0.4 MG CAPS capsule Take 0.4 mg by mouth daily.    Tiotropium Bromide-Olodaterol (STIOLTO RESPIMAT) 2.5-2.5 MCG/ACT AERS Inhale 2 puffs into the lungs daily.            Past Medical History:  Diagnosis Date   Atrial fibrillation (HCC)    BPH (benign prostatic hyperplasia)    COPD (chronic obstructive pulmonary disease) (HCC)    Hypertension        Objective:    Wts  05/11/2023       208   03/09/2023       207   12/05/22 204 lb 6.4 oz (92.7 kg)  11/03/22 209 lb (94.8 kg)  10/28/22 209 lb 3.2 oz (94.9 kg)    Vital signs reviewed  05/11/2023  - Note at rest 02 sats  90% on RA   General appearance:    mod obese amb wm nad    HEENT : Oropharynx  clear      NECK :  without  apparent JVD/ palpable Nodes/TM    LUNGS: no acc muscle use,  Mild barrel  contour chest wall with bilateral  Distant bs s audible wheeze and  without cough on insp or exp maneuvers  and mild  Hyperresonant  to  percussion bilaterally     CV:  IRIR   no s3 or murmur or increase in P2, and 1+ pitting both LEs  ABD: mod obese  soft and nontender    MS:  Nl gait/ ext warm without deformities Or obvious joint restrictions  calf tenderness, cyanosis or clubbing     SKIN: warm and dry without lesions    NEURO:  alert, approp, nl sensorium with  no motor or cerebellar deficits apparent.            Assessment

## 2023-05-11 ENCOUNTER — Encounter: Payer: Self-pay | Admitting: Internal Medicine

## 2023-05-11 ENCOUNTER — Ambulatory Visit: Payer: Medicare PPO | Admitting: Internal Medicine

## 2023-05-11 VITALS — BP 126/64 | HR 80 | Ht 68.0 in | Wt 208.0 lb

## 2023-05-11 DIAGNOSIS — J449 Chronic obstructive pulmonary disease, unspecified: Secondary | ICD-10-CM

## 2023-05-11 DIAGNOSIS — R0902 Hypoxemia: Secondary | ICD-10-CM | POA: Diagnosis not present

## 2023-05-11 MED ORDER — ALBUTEROL SULFATE (2.5 MG/3ML) 0.083% IN NEBU: 2.5000 mg | INHALATION_SOLUTION | RESPIRATORY_TRACT | 12 refills | Status: DC | PRN

## 2023-05-11 MED ORDER — DILTIAZEM HCL ER COATED BEADS 240 MG PO CP24: 240.0000 mg | ORAL_CAPSULE | Freq: Every day | ORAL | 3 refills | Status: DC

## 2023-05-11 MED ORDER — FUROSEMIDE 20 MG PO TABS: 20.0000 mg | ORAL_TABLET | Freq: Every day | ORAL | 1 refills | Status: DC | PRN

## 2023-05-11 NOTE — Assessment & Plan Note (Signed)
03/09/2023   Walked on 3  x  3  lap(s) =  approx 450  ft  @ moderated pace, stopped due to end of study with lowest 02 sats 84% min sob  - 05/11/2023   Walked on RA  x  2  lap(s) =  approx 300  ft  @ mod pace, stopped due to desat to 87%  > rec use of 02 if walking > 150 ft or much slower pace but no reason to use 02 post exertion    Has yet to grasp the concept of increase 02 demands with exertion but does have functioning inogen to supplement as needed to keep sats > 90% with peak levels of exertion   Each maintenance medication was reviewed in detail including emphasizing most importantly the difference between maintenance and prns and under what circumstances the prns are to be triggered using an action plan format where appropriate.  Total time for H and P, chart review, counseling, reviewing hfa/smi/neb/02 /pulse ox  device(s) , directly observing portions of ambulatory 02 saturation study/ and generating customized AVS unique to this office visit / same day charting  >40 min for chronic refractory respiratory  symptoms

## 2023-05-11 NOTE — Patient Instructions (Addendum)
My office will be contacting you by phone for referral to PFTs  - if possible prior to ov  - if you don't hear back from my office within one week please call us back or notify us thru MyChart and we'll address it right away.   Also  Ok to try albuterol 15 min before an activity (on alternating days between the albuterol inhaler and one day do the nebulizer )  that you know would usually make you short of breath and see if it makes any difference and if makes none then don't take albuterol after activity unless you can't catch your breath as this means it's the resting that helps, not the albuterol.  Make sure you check your oxygen saturation  AT  your highest level of activity (not after you stop)   to be sure it stays over 90% and adjust  02 flow upward to maintain this level if needed but remember to turn it back to previous settings when you stop (to conserve your supply).    Plan A = Automatic = Always=    stiolto 2 each (if bladder not getting better ok to drop to one puff each am   Plan B = Backup (to supplement plan A, not to replace it) Only use your albuterol inhaler (RED) as a rescue medication to be used if you can't catch your breath by resting or doing a relaxed purse lip breathing pattern.  - The less you use it, the better it will work when you need it. - Ok to use the inhaler up to 2 puffs  every 4 hours if you must but call for appointment if use goes up over your usual need - Don't leave home without it !!  (think of it like starter fluid)   Plan C = Crisis (instead of Plan B but only if Plan B stops working) - only use your albuterol nebulizer if you first try Plan B and it fails to help > ok to use the nebulizer up to every 4 hours but if start needing it regularly call for immediate appointment       Please schedule a follow up visit in 3 months but call sooner if needed

## 2023-05-11 NOTE — Assessment & Plan Note (Addendum)
Quit smoking 2004 with doe at that point and downhill ever since  - 11/03/2022  After extensive coaching inhaler device,  effectiveness =    80% with smi > start stiolto and more approp saba  - 11/03/2022   Walked on RA  x  2  lap(s) =  approx 300  ft  @ mod pace, stopped due to sob with lowest 02 sats 92%  -  05/11/2023  After extensive coaching inhaler device,  effectiveness =    75%  with smi/ hfa  so rec d/c duoneb and just use saba as rescue   Overuse of saba/sama in pt with decreased fos so try stiolto 1or 2 puffs each am am depending on severity of reduction in FOS and use saba as plan B and C-  see avs for instructions unique to this ov   Specifically: Re SABA :  I spent extra time with pt today reviewing appropriate use of albuterol for prn use on exertion with the following points: 1) saba is for relief of sob that does not improve by walking a slower pace or resting but rather if the pt does not improve after trying this first. 2) If the pt is convinced, as many are, that saba helps recover from activity faster then it's easy to tell if this is the case by re-challenging : ie stop, take the inhaler, then p 5 minutes try the exact same activity (intensity of workload) that just caused the symptoms and see if they are substantially diminished or not after saba 3) if there is an activity that reproducibly causes the symptoms, try the saba 15 min before the activity on alternate days   If in fact the saba really does help, then fine to continue to use it prn but advised may need to look closer at the maintenance regimen (stiolto as above)  being used to achieve better control of airways disease with exertion.

## 2023-05-19 DIAGNOSIS — R7303 Prediabetes: Secondary | ICD-10-CM | POA: Diagnosis not present

## 2023-05-19 DIAGNOSIS — E785 Hyperlipidemia, unspecified: Secondary | ICD-10-CM | POA: Diagnosis not present

## 2023-05-25 ENCOUNTER — Encounter: Payer: Self-pay | Admitting: Internal Medicine

## 2023-05-25 DIAGNOSIS — J449 Chronic obstructive pulmonary disease, unspecified: Secondary | ICD-10-CM | POA: Diagnosis not present

## 2023-05-25 DIAGNOSIS — R600 Localized edema: Secondary | ICD-10-CM | POA: Diagnosis not present

## 2023-05-25 DIAGNOSIS — N400 Enlarged prostate without lower urinary tract symptoms: Secondary | ICD-10-CM | POA: Diagnosis not present

## 2023-05-25 DIAGNOSIS — Z87891 Personal history of nicotine dependence: Secondary | ICD-10-CM | POA: Diagnosis not present

## 2023-05-25 DIAGNOSIS — R7303 Prediabetes: Secondary | ICD-10-CM | POA: Diagnosis not present

## 2023-05-25 DIAGNOSIS — E785 Hyperlipidemia, unspecified: Secondary | ICD-10-CM | POA: Diagnosis not present

## 2023-05-25 DIAGNOSIS — K219 Gastro-esophageal reflux disease without esophagitis: Secondary | ICD-10-CM | POA: Diagnosis not present

## 2023-05-25 DIAGNOSIS — I482 Chronic atrial fibrillation, unspecified: Secondary | ICD-10-CM | POA: Diagnosis not present

## 2023-05-25 DIAGNOSIS — K429 Umbilical hernia without obstruction or gangrene: Secondary | ICD-10-CM | POA: Diagnosis not present

## 2023-06-26 DIAGNOSIS — H40013 Open angle with borderline findings, low risk, bilateral: Secondary | ICD-10-CM | POA: Diagnosis not present

## 2023-08-01 ENCOUNTER — Encounter: Payer: Self-pay | Admitting: Cardiology

## 2023-08-01 ENCOUNTER — Ambulatory Visit: Payer: Medicare PPO | Attending: Cardiology | Admitting: Cardiology

## 2023-08-01 VITALS — BP 158/78 | HR 82 | Ht 70.0 in | Wt 208.8 lb

## 2023-08-01 DIAGNOSIS — I480 Paroxysmal atrial fibrillation: Secondary | ICD-10-CM

## 2023-08-01 DIAGNOSIS — D6869 Other thrombophilia: Secondary | ICD-10-CM | POA: Diagnosis not present

## 2023-08-01 DIAGNOSIS — R0600 Dyspnea, unspecified: Secondary | ICD-10-CM | POA: Diagnosis not present

## 2023-08-01 NOTE — Progress Notes (Signed)
Clinical Summary [name] is a 77 y.o.male seen today for follow up of the following medical problems.   1.Afib - new diagnosis during 12/2016 admission, self converted to SR that admission - 12/2022 30 day monitor: primarily SR, rare PAF.   - did not feel well on dilt 300, tolerates 240mg  daily - rare palpitations, overall short in duration.  - no bleeding on eliquis.     2.DOE 11/2022 echo: LVEF 55-60%, grade I dd, normal RV, normal PASP,  - chronic SOB, has home O2 as needed.    3. COPD on home O2 - compliant with inhalers - followed by Dr Cole [name]  4. Elevated blood pressure - bp elevated in clinic. Last visit in Aug with pulm was well controlled  Past Medical History:  Diagnosis Date   Atrial fibrillation (HCC)    BPH (benign prostatic hyperplasia)    COPD (chronic obstructive pulmonary disease) (HCC)    Hypertension      No Known Allergies   Current Outpatient Medications  Medication Sig Dispense Refill   albuterol (PROVENTIL) (2.5 MG/3ML) 0.083% nebulizer solution Take 3 mLs (2.5 mg total) by nebulization every 4 (four) hours as needed for wheezing or shortness of breath. 75 mL 12   albuterol (VENTOLIN HFA) 108 (90 Base) MCG/ACT inhaler Inhale 2 puffs into the lungs every 6 (six) hours as needed for wheezing or shortness of breath. 18 g 3   DILT-XR 240 MG 24 hr capsule Take 240 mg by mouth daily.     ELIQUIS 5 MG TABS tablet Take 5 mg by mouth 2 (two) times daily.     famotidine (PEPCID) 20 MG tablet One after supper 30 tablet 11   furosemide (LASIX) 20 MG tablet Take 1 tablet (20 mg total) by mouth daily as needed. 30 tablet 1   guaiFENesin (MUCINEX) 600 MG 12 hr tablet Take by mouth 2 (two) times daily.     pantoprazole (PROTONIX) 40 MG tablet Take 1 tablet (40 mg total) by mouth daily. Take 30-60 min before first meal of the day 30 tablet 2   tamsulosin (FLOMAX) 0.4 MG CAPS capsule Take 0.4 mg by mouth daily.      Tiotropium Bromide-Olodaterol (STIOLTO  RESPIMAT) 2.5-2.5 MCG/ACT AERS Inhale 2 puffs into the lungs daily. 4 g 11   No current facility-administered medications for this visit.     Past Surgical History:  Procedure Laterality Date   COLONOSCOPY N/A 04/13/2017   Procedure: COLONOSCOPY;  Surgeon: Malissa Hippo, MD;  Location: [facility];  Service: Endoscopy;  Laterality: N/A;  12:00   HERNIA REPAIR     POLYPECTOMY  04/13/2017   Procedure: POLYPECTOMY;  Surgeon: Malissa Hippo, MD;  Location: [facility];  Service: Endoscopy;;  colon     No Known Allergies    Family History  Problem Relation Age of Onset   Heart attack Mother 52   Atrial fibrillation Brother      Social History Cole [name] reports that he quit smoking about 20 years ago. His smoking use included cigarettes. He has never used smokeless tobacco. Cole [name] reports no history of alcohol use.   Review of Systems CONSTITUTIONAL: No weight loss, fever, chills, weakness or fatigue.  HEENT: Eyes: No visual loss, blurred vision, double vision or yellow sclerae.No hearing loss, sneezing, congestion, runny nose or sore throat.  SKIN: No rash or itching.  CARDIOVASCULAR: per hpi RESPIRATORY: per hpi GASTROINTESTINAL: No anorexia, nausea, vomiting or diarrhea. No abdominal  pain or blood.  GENITOURINARY: No burning on urination, no polyuria NEUROLOGICAL: No headache, dizziness, syncope, paralysis, ataxia, numbness or tingling in the extremities. No change in bowel or bladder control.  MUSCULOSKELETAL: No muscle, back pain, joint pain or stiffness.  LYMPHATICS: No enlarged nodes. No history of splenectomy.  PSYCHIATRIC: No history of depression or anxiety.  ENDOCRINOLOGIC: No reports of sweating, cold or heat intolerance. No polyuria or polydipsia.  Marland Kitchen   Physical Examination Today's Vitals   08/01/23 1512 08/01/23 1537  BP: (!) 154/72 (!) 158/78  Pulse: 82   SpO2: 93%   Weight: 208 lb 12.8 oz (94.7 kg)   Height: 5\' 10"  (1.778 m)    Body mass index is  29.96 kg/m.; Gen: resting comfortably, no acute distress HEENT: no scleral icterus, pupils equal round and reactive, no palptable cervical adenopathy,  CV: RRR, no m/rg, no jvd Resp: Clear to auscultation bilaterally GI: abdomen is soft, non-tender, non-distended, normal bowel sounds, no hepatosplenomegaly MSK: extremities are warm, no edema.  Skin: warm, no rash Neuro:  no focal deficits Psych: appropriate affect   Diagnostic Studies     Assessment and Plan   1.PAF/acquired thrombophilia - mild symptoms, not to the point he favors adjusting medications - of note did not tolerate higher doses of diltiazem, likely would add bisoprolol if additional medical therapy becomes indicated - continue eliquis for stroke prevention  2.DOE - appears mainly related to his COPD - recent echo overall benign other than mild diastolic dysfunction  3. Elevated blood pressure - elevated in clinic, he will update [place] on home numbers in 1 week - likely would add losartan if additional agent is needed.      Antoine Poche, M.D.

## 2023-08-01 NOTE — Patient Instructions (Addendum)
Medication Instructions:   Continue all current medications.   Labwork:  none  Testing/Procedures:  none  Follow-Up:  6 months   Any Other Special Instructions Will Be Listed Below (If Applicable).  Update office in 1 week with your BP readings.   If you need a refill on your cardiac medications before your next appointment, please call your pharmacy.

## 2023-08-13 NOTE — Progress Notes (Unsigned)
Cole [name], male    DOB: 1946/03/24    MRN: [id number]  Brief patient profile:  89  yowm quit smoking 2004 with doe  referred to pulmonary clinic in [place]  11/03/2022 by Dr Margo Aye  for sob / cough x 2020    Afib x around 2019   History of Present Illness  11/03/2022  Pulmonary/ 1st office eval/ Cole [name] / [facility]  Chief Complaint  Patient presents with   Consult    COPD/ SOB/ Cough   Dyspnea:  last grocery store shopping 2lpm POC  Cough: worse at  hs min greenish after vigorous throat clearing  Sleep: sleeps on floor due to back/ 2 pillows  SABA use: neb 4 x daily not timed to take pre-exertion  02: only with exertion  none at hs  Rec Plan A = Automatic = Always=    Stiolto 2 puffs 1st thing each am and Pantoprazole (protonix) 40 mg   Take  30-60 min before first meal of the day and Pepcid (famotidine)  20 mg after supper until return to office  Work on inhaler technique:   Plan B = Backup (to supplement plan A, not to replace it) Only use your albuterol inhaler as a rescue medication Plan C = Crisis (instead of Plan B but only if Plan B stops working) - only use your albuterol-ipatropium nebulizer if you first try Plan B Also  Ok to try albuterol 15 min before an activity (on alternating days)  that you know would usually make you short of breath and see if it makes any difference and if makes none then don't take albuterol after activity unless you can't catch your breath as this means it's the resting that helps, not the albuterol. 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  maint on stiolto  2 each am  Chief Complaint  Patient presents  with   Follow-up    Pt f/u for COPD he states that he is doing a lot better on the stiolto inhaler  Dyspnea:  not checking walking but doing better walking mb and back  Cough: better  Sleeping: on floor  SABA use: every few days when overdoes it > hfa, no neb any more  02: not wearing  Rec Plan A = Automatic = Always=    continue stiolto 2 puffs each am Plan B = Backup (to supplement plan A, not to replace it) Only use your albuterol inhaler as a rescue medication Plan C = Crisis (instead of Plan B but only if Plan B stops working) - only use your albuterol nebulizer if you first try Plan B  Also  Ok to try albuterol 15 min before an activity (on alternating days)  that you know would usually make you short of breath      Please schedule a follow up visit in 3 months but call sooner if needed with pfts > not done as of  03/09/2023    03/09/2023  f/u ov/[place] office/Cole [name] re: GOLD group A  maint on stiolto   Chief Complaint  Patient presents with   Follow-up   Dyspnea: house to mb is incline up to mb not pre treating or checking sats as rec  Cough: clear this am / typically worse first thing am  Sleeping: on floor with 2 pillows  SABA use: twice a day  02: has inogen and typically uses p supper at rest s checking 02 sats Rec For cough / congestion > mucinex or mucinex dm up to 1200 mg every 12 hours  Work on inhaler technique:    Ok to try albuterol 15 min before an activity (on alternating days)  that you know would usually make you short of breath  Make sure you check your oxygen saturation  AT  your highest level of activity (not after you stop)   to be sure it stays over 90%  Your constipation may be due to the cardizem but discus with prescriber Stiolto may also be contributing and may need to reduce to one pff daily  Please schedule a follow up office visit in 6 weeks, call sooner if needed with all medications /inhalers/ solutions in hand    05/11/2023  f/u ov/[place]  office/Cole [name] re: GOLD group A maint on stioto did  bring meds  Chief Complaint  Patient presents with   COPD   Dyspnea:  weed eating in early am/ too hot to go MB Cough: very little  Sleeping: on floor x years x 2  SABA use: once or twice daily if outside in heat  02: uses inogen prn p ex  Rec My office will be contacting you by phone for referral to PFTs   Also  Ok to try albuterol 15 min before an activity (on alternating days between the albuterol inhaler and one day do the nebulizer )  that you know would usually make you short of breath  Make sure you check your oxygen saturation  AT  your highest level of activity (not after you stop)   to be sure it stays over 90% and adjust  02 flow upward to maintain this level if needed but remember to turn it back to previous settings when you stop (to conserve your supply).  Plan A = Automatic = Always=    stiolto 2 each (if bladder not getting better ok to drop to one puff each am  Plan B = Backup (to supplement plan A, not to replace it) Only use your albuterol inhaler (RED) as a rescue medicatio   08/14/2023  f/u ov/[place] office/Cole [name] re: *** maint on ***  No chief complaint on file.   Dyspnea:  *** Cough: *** Sleeping: ***   resp cc  SABA use: *** 02: ***  Lung cancer screening: ***   No obvious day to day or daytime variability or assoc excess/ purulent sputum or mucus plugs or hemoptysis or cp or chest tightness, subjective wheeze or overt sinus or hb symptoms.    Also denies any obvious fluctuation of symptoms with weather or environmental changes or other aggravating or alleviating factors except as outlined above   No unusual exposure hx or h/o childhood pna/ asthma or knowledge of premature birth.  Current Allergies, Complete Past Medical History, Past Surgical History, Family History, and Social History were reviewed in [employer] record.  ROS  The following are not active complaints unless  bolded Hoarseness, sore throat, dysphagia, dental problems, itching, sneezing,  nasal congestion or discharge of excess mucus or purulent secretions, ear ache,   fever, chills, sweats, unintended wt  loss or wt gain, classically pleuritic or exertional cp,  orthopnea pnd or arm/hand swelling  or leg swelling, presyncope, palpitations, abdominal pain, anorexia, nausea, vomiting, diarrhea  or change in bowel habits or change in bladder habits, change in stools or change in urine, dysuria, hematuria,  rash, arthralgias, visual complaints, headache, numbness, weakness or ataxia or problems with walking or coordination,  change in mood or  memory.        No outpatient medications have been marked as taking for the 08/14/23 encounter (Appointment) with Cole Cowden, MD.              Past Medical History:  Diagnosis Date   Atrial fibrillation [facility])    BPH (benign prostatic hyperplasia)    COPD (chronic obstructive pulmonary disease) (HCC)    Hypertension        Objective:    Wts  08/14/2023       ***  05/11/2023       208   03/09/2023       207   12/05/22 204 lb 6.4 oz (92.7 kg)  11/03/22 209 lb (94.8 kg)  10/28/22 209 lb 3.2 oz (94.9 kg)    Vital signs reviewed  08/14/2023  - Note at rest 02 sats  ***% on ***   General appearance:    ***     Mild barre  1+ pitting both LEs***        Assessment

## 2023-08-14 ENCOUNTER — Ambulatory Visit: Payer: Medicare PPO | Admitting: Internal Medicine

## 2023-08-15 ENCOUNTER — Encounter: Payer: Self-pay | Admitting: Cardiology

## 2023-09-04 ENCOUNTER — Telehealth: Payer: Self-pay | Admitting: *Deleted

## 2023-09-04 DIAGNOSIS — Z79899 Other long term (current) drug therapy: Secondary | ICD-10-CM

## 2023-09-04 DIAGNOSIS — R030 Elevated blood-pressure reading, without diagnosis of hypertension: Secondary | ICD-10-CM

## 2023-09-04 MED ORDER — LOSARTAN POTASSIUM 25 MG PO TABS: 25.0000 mg | ORAL_TABLET | Freq: Every day | ORAL | 6 refills | Status: DC

## 2023-09-04 NOTE — Telephone Encounter (Signed)
-----   Message from Dina Rich sent at 09/03/2023  7:45 AM EST ----- BP's on average too high. Please start losartan 25mg  daily, needs bmet 2 weeks. Update [place] bp's again in 2weeks  Dominga Ferry MD

## 2023-09-04 NOTE — Telephone Encounter (Signed)
Notified wife (Patsy), copy to pcp.  New prescription sent to [facility] & lab order mailed to home.

## 2023-09-14 ENCOUNTER — Other Ambulatory Visit: Payer: Self-pay | Admitting: Internal Medicine

## 2023-09-17 NOTE — Progress Notes (Deleted)
 Cole [name], male    DOB: 1946/08/29    MRN: [id number]  Brief patient profile:  41  yowm quit smoking 2004 with doe  referred to pulmonary clinic in [place]  11/03/2022 by Dr Shona  for sob / cough x 2020    Afib x around 2019   History of Present Illness  11/03/2022  Pulmonary/ 1st office eval/ [name] / [facility] [facility]  Chief Complaint  Patient presents with   Consult    COPD/ SOB/ Cough   Dyspnea:  last grocery store shopping 2lpm POC  Cough: worse at  hs min greenish after vigorous throat clearing  Sleep: sleeps on floor due to back/ 2 pillows  SABA use: neb 4 x daily not timed to take pre-exertion  02: only with exertion  none at hs  Rec Plan A = Automatic = Always=    Stiolto 2 puffs 1st thing each am and Pantoprazole  (protonix ) 40 mg   Take  30-60 min before first meal of the day and Pepcid  (famotidine )  20 mg after supper until return to office  Work on inhaler technique:   Plan B = Backup (to supplement plan A, not to replace it) Only use your albuterol  inhaler as a rescue medication Plan C = Crisis (instead of Plan B but only if Plan B stops working) - only use your albuterol -ipatropium nebulizer if you first try Plan B Also  Ok to try albuterol  15 min before an activity (on alternating days)  that you know would usually make you short of breath and see if it makes any difference and if makes none then don't take albuterol  after activity unless you can't catch your breath as this means it's the resting that helps, not the albuterol . Depomedrol 120 mg IM  Cough/ congestion >  400  mg up every 4 hours  - 1200 mg every 12 hours  GERD diet reviewed, bed blocks rec  Make sure you check your oxygen  saturation  AT  your highest level of activity (not after you stop)   to be sure it stays over 90%  Please schedule a follow up office visit in 4 weeks    12/05/2022  f/u ov/[name] office/Ledarius Leeson re: copd gold ?/Group B  maint on stiolto  2 each am  Chief Complaint  Patient presents  with   Follow-up    Pt f/u for COPD he states that he is doing a lot better on the stiolto inhaler  Dyspnea:  not checking walking but doing better walking mb and back  Cough: better  Sleeping: on floor  SABA use: every few days when overdoes it > hfa, no neb any more  02: not wearing  Rec Plan A = Automatic = Always=    continue stiolto 2 puffs each am Plan B = Backup (to supplement plan A, not to replace it) Only use your albuterol  inhaler as a rescue medication Plan C = Crisis (instead of Plan B but only if Plan B stops working) - only use your albuterol  nebulizer if you first try Plan B  Also  Ok to try albuterol  15 min before an activity (on alternating days)  that you know would usually make you short of breath      Please schedule a follow up visit in 3 months but call sooner if needed with pfts > not done as of  03/09/2023    03/09/2023  f/u ov/[place] office/[name] re: GOLD group A  maint on stiolto   Chief Complaint  Patient presents with   Follow-up   Dyspnea: house to mb is incline up to mb not pre treating or checking sats as rec  Cough: clear this am / typically worse first thing am  Sleeping: on floor with 2 pillows  SABA use: twice a day  02: has inogen and typically uses p supper at rest s checking 02 sats Rec For cough / congestion > mucinex  or mucinex  dm up to 1200 mg every 12 hours  Work on inhaler technique:    Ok to try albuterol  15 min before an activity (on alternating days)  that you know would usually make you short of breath  Make sure you check your oxygen  saturation  AT  your highest level of activity (not after you stop)   to be sure it stays over 90%  Your constipation may be due to the cardizem  but discus with prescriber Stiolto may also be contributing and may need to reduce to one pff daily  Please schedule a follow up office visit in 6 weeks, call sooner if needed with all medications /inhalers/ solutions in hand    05/11/2023  f/u ov/[place]  office/Mechel Haggard re: GOLD group A maint on stioto did  bring meds  Chief Complaint  Patient presents with   COPD   Dyspnea:  weed eating in early am/ too hot to go MB Cough: very little  Sleeping: on floor x years x 2  SABA use: once or twice daily if outside in heat  02: uses inogen prn p ex  Rec My office will be contacting you by phone for referral to PFTs  - if possible prior to ov   Also  Ok to try albuterol  15 min before an activity (on alternating days between the albuterol  inhaler and one day do the nebulizer )  that you know would usually make you short of breath  Make sure you check your oxygen  saturation  AT  your highest level of activity (not after you stop)   to be sure it stays over 90%  Plan A = Automatic = Always=    stiolto 2 each (if bladder not getting better ok to drop to one puff each am  Plan B = Backup (to supplement plan A, not to replace it) Only use your albuterol  inhaler (RED) as a rescue medication  Plan C = Crisis (instead of Plan B but only if Plan B stops working) - only use your albuterol  nebulizer if you first try Plan B    09/19/2023  f/u ov/[place] office/[name] re: *** maint on ***  No chief complaint on file.   Dyspnea:  *** Cough: *** Sleeping: ***   resp cc  SABA use: *** 02: ***  Lung cancer screening: ***   No obvious day to day or daytime variability or assoc excess/ purulent sputum or mucus plugs or hemoptysis or cp or chest tightness, subjective wheeze or overt sinus or hb symptoms.    Also denies any obvious fluctuation of symptoms with weather or environmental changes or other aggravating or alleviating factors except as outlined above   No unusual exposure hx or h/o childhood pna/ asthma or knowledge of premature birth.  Current Allergies, Complete Past Medical History, Past Surgical History, Family History, and Social History were reviewed in [employer] record.  ROS  The following are not active complaints  unless bolded Hoarseness, sore throat, dysphagia, dental problems, itching, sneezing,  nasal congestion or discharge of excess mucus or purulent secretions, ear  ache,   fever, chills, sweats, unintended wt loss or wt gain, classically pleuritic or exertional cp,  orthopnea pnd or arm/hand swelling  or leg swelling, presyncope, palpitations, abdominal pain, anorexia, nausea, vomiting, diarrhea  or change in bowel habits or change in bladder habits, change in stools or change in urine, dysuria, hematuria,  rash, arthralgias, visual complaints, headache, numbness, weakness or ataxia or problems with walking or coordination,  change in mood or  memory.        No outpatient medications have been marked as taking for the 09/19/23 encounter (Appointment) with Darlean Ozell NOVAK, MD.            Past Medical History:  Diagnosis Date   Atrial fibrillation [facility])    BPH (benign prostatic hyperplasia)    COPD (chronic obstructive pulmonary disease) (HCC)    Hypertension        Objective:    Wts  09/19/2023         ***  05/11/2023       208   03/09/2023       207   12/05/22 204 lb 6.4 oz (92.7 kg)  11/03/22 209 lb (94.8 kg)  10/28/22 209 lb 3.2 oz (94.9 kg)      Vital signs reviewed  09/19/2023  - Note at rest 02 sats  ***% on ***   General appearance:    ***   Mild barrel   1+ pitting both LEs***          Assessment

## 2023-09-19 ENCOUNTER — Ambulatory Visit: Payer: Medicare PPO | Admitting: Internal Medicine

## 2023-09-20 ENCOUNTER — Encounter: Payer: Self-pay | Admitting: Cardiology

## 2023-09-20 DIAGNOSIS — R030 Elevated blood-pressure reading, without diagnosis of hypertension: Secondary | ICD-10-CM | POA: Diagnosis not present

## 2023-09-20 DIAGNOSIS — Z79899 Other long term (current) drug therapy: Secondary | ICD-10-CM | POA: Diagnosis not present

## 2023-09-21 LAB — BASIC METABOLIC PANEL WITH GFR
BUN/Creatinine Ratio: 15 (ref 10–24)
BUN: 16 mg/dL (ref 8–27)
CO2: 26 mmol/L (ref 20–29)
Calcium: 9.5 mg/dL (ref 8.6–10.2)
Chloride: 103 mmol/L (ref 96–106)
Creatinine, Ser: 1.06 mg/dL (ref 0.76–1.27)
Glucose: 102 mg/dL — ABNORMAL HIGH (ref 70–99)
Potassium: 4.2 mmol/L (ref 3.5–5.2)
Sodium: 143 mmol/L (ref 134–144)
eGFR: 71 mL/min/1.73

## 2023-10-09 ENCOUNTER — Telehealth: Payer: Self-pay | Admitting: *Deleted

## 2023-10-09 NOTE — Telephone Encounter (Signed)
Normal labs   Dominga Ferry MD

## 2023-10-09 NOTE — Telephone Encounter (Signed)
10/09/2023  4:56 PM EST Back to Top    Notified, copy to pcp.

## 2023-10-09 NOTE — Telephone Encounter (Signed)
Notified patient & wife.  They will bring [place] another log in about 10-14 days. October 08, 2023 Antoine Poche, MD to [facility]     10/08/23  9:26 AM Result Note Last bp's were up and down a bit, make sure sitting down for 5 minutes before taking. DOes he have any additional bp's for [place], if not could he update [place] later this week Dominga Ferry MD

## 2023-10-15 NOTE — Progress Notes (Signed)
 Cole [name], male    DOB: 01/10/1946    MRN: [id number]  Brief patient profile:  50 yowm  quit smoking 2004 at onset  doe  referred to pulmonary clinic in [place]  11/03/2022 by Dr Shona  for sob / cough x 2020    Afib x around 2019   History of Present Illness  11/03/2022  Pulmonary/ 1st office eval/ [name] / [place] [facility]  Chief Complaint  Patient presents with   Consult    COPD/ SOB/ Cough   Dyspnea:  last grocery store shopping 2lpm POC  Cough: worse at  hs min greenish after vigorous throat clearing  Sleep: sleeps on floor due to back/ 2 pillows  SABA use: neb 4 x daily not timed to take pre-exertion  02: only with exertion  none at hs  Rec Plan A = Automatic = Always=    Stiolto 2 puffs 1st thing each am and Pantoprazole  (protonix ) 40 mg   Take  30-60 min before first meal of the day and Pepcid  (famotidine )  20 mg after supper until return to office  Work on inhaler technique:   Plan B = Backup (to supplement plan A, not to replace it) Only use your albuterol  inhaler as a rescue medication Plan C = Crisis (instead of Plan B but only if Plan B stops working) - only use your albuterol -ipatropium nebulizer if you first try Plan B Also  Ok to try albuterol  15 min before an activity (on alternating days)  that you know would usually make you short of breath and see if it makes any difference and if makes none then don't take albuterol  after activity unless you can't catch your breath as this means it's the resting that helps, not the albuterol . Depomedrol 120 mg IM  Cough/ congestion >  400  mg up every 4 hours  - 1200 mg every 12 hours  GERD diet reviewed, bed blocks rec  Make sure you check your oxygen  saturation  AT  your highest level of activity (not after you stop)   to be sure it stays over 90%  Please schedule a follow up office visit in 4 weeks    12/05/2022  f/u ov/[place] office/[name] re: copd gold ?/Group B  maint on stiolto  2 each am  Chief Complaint  Patient  presents with   Follow-up    Pt f/u for COPD he states that he is doing a lot better on the stiolto inhaler  Dyspnea:  not checking walking but doing better walking mb and back  Cough: better  Sleeping: on floor  SABA use: every few days when overdoes it > hfa, no neb any more  02: not wearing  Rec Plan A = Automatic = Always=    continue stiolto 2 puffs each am Plan B = Backup (to supplement plan A, not to replace it) Only use your albuterol  inhaler as a rescue medication Plan C = Crisis (instead of Plan B but only if Plan B stops working) - only use your albuterol  nebulizer if you first try Plan B  Also  Ok to try albuterol  15 min before an activity (on alternating days)  that you know would usually make you short of breath      Please schedule a follow up visit in 3 months but call sooner if needed with pfts > not done as of  03/09/2023    03/09/2023  f/u ov/[place] [facility]/[name] re: GOLD group B  maint on stiolto  Chief Complaint  Patient presents with   Follow-up   Dyspnea: house to mb is incline up to mb not pre treating or checking sats as rec  Cough: clear this am / typically worse first thing am  Sleeping: on floor with 2 pillows  SABA use: twice a day  02: has inogen and typically uses p supper at rest s checking 02 sats Rec For cough / congestion > mucinex  or mucinex  dm up to 1200 mg every 12 hours  Work on inhaler technique:    Ok to try albuterol  15 min before an activity (on alternating days)  that you know would usually make you short of breath  Make sure you check your oxygen  saturation  AT  your highest level of activity (not after you stop)   to be sure it stays over 90%  Your constipation may be due to the cardizem  but discus with prescriber Stiolto may also be contributing and may need to reduce to one pff daily  Please schedule a follow up office visit in 6 weeks, call sooner if needed with all medications /inhalers/ solutions in hand    05/11/2023  f/u  ov/[place] office/[name] re: GOLD group B maint on stioto did  bring meds  Chief Complaint  Patient presents with   COPD   Dyspnea:  weed eating in early am/ too hot to go MB Cough: very little  Sleeping: on floor x years x 2  SABA use: once or twice daily if outside in heat  02: uses inogen prn p ex  Rec My office will be contacting you by phone for referral to PFTs  - if possible prior to ov   Also  Ok to try albuterol  15 min before an activity (on alternating days between the albuterol  inhaler and one day do the nebulizer )  that you know would usually make you short of breath  Make sure you check your oxygen  saturation  AT  your highest level of activity (not after you stop)   to be sure it stays over 90%  Plan A = Automatic = Always=    stiolto 2 each (if bladder not getting better ok to drop to one puff each am  Plan B = Backup (to supplement plan A, not to replace it) Only use your albuterol  inhaler (RED) as a rescue medication  Plan C = Crisis (instead of Plan B but only if Plan B stops working) - only use your albuterol  nebulizer if you first try Plan B    10/17/2023  f/u ov/[place] office/[name] re: GOLD group B  maint on stiolto   Chief Complaint  Patient presents with   Follow-up    Copd    Dyspnea:  walking lots more since last ov >>  some uphill to mb s stopping  Cough: none  Sleeping: on floor 2 pillows s  resp cc  SABA use: rare = few times a week 02: 2lpm at hs     No obvious day to day or daytime variability or assoc excess/ purulent sputum or mucus plugs or hemoptysis or cp or chest tightness, subjective wheeze or overt sinus or hb symptoms.    Also denies any obvious fluctuation of symptoms with weather or environmental changes or other aggravating or alleviating factors except as outlined above   No unusual exposure hx or h/o childhood pna/ asthma or knowledge of premature birth.  Current Allergies, Complete Past Medical History, Past Surgical History,  Family History, and Social History were  reviewed in [employer] [facility] electronic medical record.  ROS  The following are not active complaints unless bolded Hoarseness, sore throat, dysphagia, dental problems, itching, sneezing,  nasal congestion or discharge of excess mucus or purulent secretions, ear ache,   fever, chills, sweats, unintended wt loss or wt gain, classically pleuritic or exertional cp,  orthopnea pnd or arm/hand swelling  or leg swelling, presyncope, palpitations, abdominal pain, anorexia, nausea, vomiting, diarrhea  or change in bowel habits or change in bladder habits, change in stools or change in urine, dysuria, hematuria,  rash, arthralgias, visual complaints, headache, numbness, weakness or ataxia or problems with walking or coordination,  change in mood or  memory.        Current Meds  Medication Sig   albuterol  (PROVENTIL ) (2.5 MG/3ML) 0.083% nebulizer solution USE 1 VIAL ( ) BY NEBULIZER EVERY 4 HOURS AS NEEDED FOR WHEEZING OR SHORTNESS OF BREATH   albuterol  (VENTOLIN  HFA) 108 (90 Base) MCG/ACT inhaler Inhale 2 puffs into the lungs every 6 (six) hours as needed for wheezing or shortness of breath.   DILT-XR 240 MG 24 hr capsule Take 240 mg by mouth daily.   ELIQUIS 5 MG TABS tablet Take 5 mg by mouth 2 (two) times daily.   famotidine  (PEPCID ) 20 MG tablet One after supper   furosemide  (LASIX ) 20 MG tablet Take 1 tablet (20 mg total) by mouth daily as needed.   guaiFENesin  (MUCINEX ) 600 MG 12 hr tablet Take by mouth 2 (two) times daily.   losartan  (COZAAR ) 25 MG tablet Take 1 tablet (25 mg total) by mouth daily.   pantoprazole  (PROTONIX ) 40 MG tablet Take 1 tablet (40 mg total) by mouth daily. Take 30-60 min before first meal of the day   tamsulosin  (FLOMAX ) 0.4 MG CAPS capsule Take 0.4 mg by mouth daily.    Tiotropium Bromide-Olodaterol (STIOLTO RESPIMAT ) 2.5-2.5 MCG/ACT AERS Inhale 2 puffs into the lungs daily.            Past Medical History:  Diagnosis Date    Atrial fibrillation (HCC)    BPH (benign prostatic hyperplasia)    COPD (chronic obstructive pulmonary disease) (HCC)    Hypertension        Objective:    Wts  10/17/2023         208  05/11/2023       208   03/09/2023       207   12/05/22 204 lb 6.4 oz (92.7 kg)  11/03/22 209 lb (94.8 kg)  10/28/22 209 lb 3.2 oz (94.9 kg)      Vital signs reviewed  10/17/2023  - Note at rest 02 sats  88% on RA   General appearance:    amb wm nad   HEENT : Oropharynx  clear      NECK :  without  apparent JVD/ palpable Nodes/TM    LUNGS: no acc muscle use,  Mild barrel  contour chest wall with bilateral  Distant bs s audible wheeze and  without cough on insp or exp maneuvers  and mild  Hyperresonant  to  percussion bilaterally     CV:  RRR  no s3 or murmur or increase in P2, and no edema   ABD:  quite obese but soft    MS:  Nl gait/ ext warm without deformities Or obvious joint restrictions  calf tenderness, cyanosis or clubbing     SKIN: warm and dry without lesions    NEURO:  alert, approp, nl sensorium with  no  motor or cerebellar deficits apparent.       Assessment

## 2023-10-17 ENCOUNTER — Telehealth: Payer: Self-pay | Admitting: Internal Medicine

## 2023-10-17 ENCOUNTER — Ambulatory Visit: Payer: Medicare PPO | Admitting: Internal Medicine

## 2023-10-17 ENCOUNTER — Encounter: Payer: Self-pay | Admitting: Internal Medicine

## 2023-10-17 VITALS — BP 127/70 | HR 78 | Wt 208.6 lb

## 2023-10-17 DIAGNOSIS — R0902 Hypoxemia: Secondary | ICD-10-CM | POA: Diagnosis not present

## 2023-10-17 DIAGNOSIS — J449 Chronic obstructive pulmonary disease, unspecified: Secondary | ICD-10-CM

## 2023-10-17 NOTE — Assessment & Plan Note (Signed)
 03/09/2023   Walked on 3  x  3  lap(s) =  approx 450  ft  @ moderated pace, stopped due to end of study with lowest 02 sats 84% min sob  - 05/11/2023   Walked on RA  x  2  lap(s) =  approx 300  ft  @ mod pace, stopped due to desat to 87%  > rec use of 02 if walking > 150 ft or much slower pace but no reason to use 02 post exertion    Presently says ex tol better and only using 2lpm HS prn   Advised: Make sure you check your oxygen  saturation  AT  your highest level of activity (not after you stop)   to be sure it stays over 90% and adjust  02 flow upward to maintain this level if needed but remember to turn it back to previous settings when you stop (to conserve your supply).    F/u q 2m sooner prn      Each maintenance medication was reviewed in detail including emphasizing most importantly the difference between maintenance and prns and under what circumstances the prns are to be triggered using an action plan format where appropriate.  Total time for H and P, chart review, counseling, reviewing hfa/smi/ 02 /pulse ox  device(s) and generating customized AVS unique to this office visit / same day charting = 22 min

## 2023-10-17 NOTE — Patient Instructions (Signed)
No change in medications  Make sure you check your oxygen saturation  AT  your highest level of activity (not after you stop)   to be sure it stays over 90% and adjust  02 flow upward to maintain this level if needed but remember to turn it back to previous settings when you stop (to conserve your supply).   Please schedule a follow up visit in 6 months but call sooner if needed

## 2023-10-17 NOTE — Telephone Encounter (Signed)
Needs PFTs before next ov

## 2023-10-17 NOTE — Assessment & Plan Note (Signed)
 Quit smoking 2004 with doe at that point and downhill ever since  - 11/03/2022  After extensive coaching inhaler device,  effectiveness =    80% with smi > start stiolto and more approp saba  - 11/03/2022   Walked on RA  x  2  lap(s) =  approx 300  ft  @ mod pace, stopped due to sob with lowest 02 sats 92%  -  05/11/2023  After extensive coaching inhaler device,  effectiveness =    75%  with smi/ hfa  so rec d/c duoneb and just use saba as rescue   Pt is Group B in terms of symptom/risk and laba/lama therefore appropriate rx at this point >>>  continue stiolto and approp saba

## 2023-10-18 NOTE — Addendum Note (Signed)
 Addended by: Silvano Garofano N on: 10/18/2023 08:17 AM   Modules accepted: Orders

## 2023-10-18 NOTE — Telephone Encounter (Signed)
 Order was placed.

## 2023-10-19 ENCOUNTER — Ambulatory Visit: Payer: Medicare PPO | Admitting: Cardiology

## 2023-10-23 ENCOUNTER — Encounter: Payer: Self-pay | Admitting: Cardiology

## 2023-11-08 ENCOUNTER — Telehealth: Payer: Self-pay | Admitting: Internal Medicine

## 2023-11-08 NOTE — Telephone Encounter (Signed)
 Spoke with patient's wife (on [place])  regarding appointment for PFT at [facility]---scheduled Thursday 12/14/23 at 10:00 am---arrival time is 9:45 am--1st floor registration desk---will mail information and instructions to patient and she voiced her understanding

## 2023-11-16 DIAGNOSIS — R7303 Prediabetes: Secondary | ICD-10-CM | POA: Diagnosis not present

## 2023-11-16 DIAGNOSIS — N400 Enlarged prostate without lower urinary tract symptoms: Secondary | ICD-10-CM | POA: Diagnosis not present

## 2023-11-16 DIAGNOSIS — E785 Hyperlipidemia, unspecified: Secondary | ICD-10-CM | POA: Diagnosis not present

## 2023-11-22 DIAGNOSIS — I482 Chronic atrial fibrillation, unspecified: Secondary | ICD-10-CM | POA: Diagnosis not present

## 2023-11-22 DIAGNOSIS — J449 Chronic obstructive pulmonary disease, unspecified: Secondary | ICD-10-CM | POA: Diagnosis not present

## 2023-11-22 DIAGNOSIS — R600 Localized edema: Secondary | ICD-10-CM | POA: Diagnosis not present

## 2023-11-22 DIAGNOSIS — K219 Gastro-esophageal reflux disease without esophagitis: Secondary | ICD-10-CM | POA: Diagnosis not present

## 2023-11-22 DIAGNOSIS — E785 Hyperlipidemia, unspecified: Secondary | ICD-10-CM | POA: Diagnosis not present

## 2023-11-22 DIAGNOSIS — R972 Elevated prostate specific antigen [PSA]: Secondary | ICD-10-CM | POA: Diagnosis not present

## 2023-11-22 DIAGNOSIS — I10 Essential (primary) hypertension: Secondary | ICD-10-CM | POA: Diagnosis not present

## 2023-11-22 DIAGNOSIS — N400 Enlarged prostate without lower urinary tract symptoms: Secondary | ICD-10-CM | POA: Diagnosis not present

## 2023-11-22 DIAGNOSIS — R7303 Prediabetes: Secondary | ICD-10-CM | POA: Diagnosis not present

## 2023-12-14 ENCOUNTER — Inpatient Hospital Stay (HOSPITAL_COMMUNITY): Admission: RE | Admit: 2023-12-14 | Payer: Medicare PPO | Source: Ambulatory Visit

## 2023-12-19 ENCOUNTER — Ambulatory Visit (HOSPITAL_COMMUNITY)
Admission: RE | Admit: 2023-12-19 | Discharge: 2023-12-19 | Disposition: A | Discharge status: Home / Self Care | Payer: Medicare PPO | Source: Ambulatory Visit | Attending: Internal Medicine | Admitting: Internal Medicine

## 2023-12-19 DIAGNOSIS — J449 Chronic obstructive pulmonary disease, unspecified: Secondary | ICD-10-CM | POA: Diagnosis not present

## 2023-12-19 LAB — PULMONARY FUNCTION TEST
DL/VA % pred: 67 %
DL/VA: 2.59 ml/min/mmHg/L
DLCO unc % pred: 39 %
DLCO unc: 9.44 ml/min/mmHg
FEF 25-75 Post: 0.35 L/s
FEF 25-75 Pre: 0.38 L/s
FEF2575-%Change-Post: -8 %
FEF2575-%Pred-Post: 17 %
FEF2575-%Pred-Pre: 19 %
FEV1-%Change-Post: -1 %
FEV1-%Pred-Post: 38 %
FEV1-%Pred-Pre: 39 %
FEV1-Post: 1.11 L
FEV1-Pre: 1.13 L
FEV1FVC-%Change-Post: -1 %
FEV1FVC-%Pred-Pre: 70 %
FEV6-%Change-Post: -1 %
FEV6-%Pred-Post: 52 %
FEV6-%Pred-Pre: 53 %
FEV6-Post: 1.97 L
FEV6-Pre: 2 L
FEV6FVC-%Change-Post: 0 %
FEV6FVC-%Pred-Post: 94 %
FEV6FVC-%Pred-Pre: 95 %
FVC-%Change-Post: 0 %
FVC-%Pred-Post: 55 %
FVC-%Pred-Pre: 55 %
FVC-Post: 2.23 L
FVC-Pre: 2.25 L
Post FEV1/FVC ratio: 50 %
Post FEV6/FVC ratio: 88 %
Pre FEV1/FVC ratio: 50 %
Pre FEV6/FVC Ratio: 89 %
RV % pred: 267 %
RV: 7.1 L
TLC % pred: 133 %
TLC: 9.44 L

## 2023-12-19 MED ORDER — ALBUTEROL SULFATE (2.5 MG/3ML) 0.083% IN NEBU
2.5000 mg | INHALATION_SOLUTION | Freq: Once | RESPIRATORY_TRACT | Status: AC
  Administered 2023-12-19: 2.5 mg via RESPIRATORY_TRACT

## 2023-12-28 NOTE — Progress Notes (Signed)
 Called the pt and there was no answer and no option given to leave msg, Will call back.

## 2024-01-02 ENCOUNTER — Other Ambulatory Visit: Payer: Self-pay | Admitting: Internal Medicine

## 2024-01-11 ENCOUNTER — Other Ambulatory Visit: Payer: Self-pay | Admitting: Internal Medicine

## 2024-01-30 ENCOUNTER — Ambulatory Visit: Payer: Medicare PPO | Attending: Cardiology | Admitting: Cardiology

## 2024-01-30 ENCOUNTER — Encounter: Payer: Self-pay | Admitting: Cardiology

## 2024-01-30 VITALS — BP 154/85 | HR 74 | Ht 69.0 in | Wt 212.0 lb

## 2024-01-30 DIAGNOSIS — I480 Paroxysmal atrial fibrillation: Secondary | ICD-10-CM

## 2024-01-30 DIAGNOSIS — R030 Elevated blood-pressure reading, without diagnosis of hypertension: Secondary | ICD-10-CM

## 2024-01-30 DIAGNOSIS — D6869 Other thrombophilia: Secondary | ICD-10-CM | POA: Diagnosis not present

## 2024-01-30 MED ORDER — BISOPROLOL FUMARATE 5 MG PO TABS: ORAL_TABLET | ORAL | 1 refills | Status: DC

## 2024-01-30 NOTE — Progress Notes (Signed)
 Clinical Summary Cole [name] is a 78 y.o.male seen today for follow up of the following medical problems.    1.PAF - new diagnosis during 12/2016 admission, self converted to SR that admission - 12/2022 30 day monitor: primarily SR, rare PAF.    - did not feel well on dilt 300, tolerates 240mg  daily  - some palpitations, about once every 3 weeks. Can last several hours. Can check HR and can be up to 130s - no bleeding on eliquis.    2.DOE 11/2022 echo: LVEF 55-60%, grade I dd, normal RV, normal PASP,  - chronic SOB, has home O2 as needed.      3. COPD on home O2 - compliant with inhalers - followed by Dr Waymond Hailey   4. Elevated blood pressure - bp elevated in clinic today  5. LE edema - has prn lasix , seldom takes  Past Medical History:  Diagnosis Date   Atrial fibrillation (HCC)    BPH (benign prostatic hyperplasia)    COPD (chronic obstructive pulmonary disease) (HCC)    Hypertension      No Known Allergies   Current Outpatient Medications  Medication Sig Dispense Refill   albuterol  (PROVENTIL ) (2.5 MG/3ML) 0.083% nebulizer solution USE 1 VIAL ( ) BY NEBULIZER EVERY 4 HOURS AS NEEDED FOR WHEEZING OR SHORTNESS OF BREATH 75 mL 12   albuterol  (VENTOLIN  HFA) 108 (90 Base) MCG/ACT inhaler Inhale 2 puffs into the lungs every 6 (six) hours as needed for wheezing or shortness of breath. 18 g 3   DILT-XR 240 MG 24 hr capsule Take 240 mg by mouth daily.     ELIQUIS 5 MG TABS tablet Take 5 mg by mouth 2 (two) times daily.     famotidine  (PEPCID ) 20 MG tablet One after supper 30 tablet 11   furosemide  (LASIX ) 20 MG tablet Take 1 tablet (20 mg total) by mouth daily as needed. 30 tablet 1   guaiFENesin  (MUCINEX ) 600 MG 12 hr tablet Take by mouth 2 (two) times daily.     losartan  (COZAAR ) 25 MG tablet Take 1 tablet (25 mg total) by mouth daily. 30 tablet 6   pantoprazole  (PROTONIX ) 40 MG tablet Take 1 tablet (40 mg total) by mouth daily. Take 30-60 min before first meal of the day  30 tablet 2   STIOLTO RESPIMAT  2.5-2.5 MCG/ACT AERS INHALE TWO PUFFS ONCE DAILY 4 g 11   tamsulosin  (FLOMAX ) 0.4 MG CAPS capsule Take 0.4 mg by mouth daily.      No current facility-administered medications for this visit.     Past Surgical History:  Procedure Laterality Date   COLONOSCOPY N/A 04/13/2017   Procedure: COLONOSCOPY;  Surgeon: Ruby Corporal, MD;  Location: [facility];  Service: Endoscopy;  Laterality: N/A;  12:00   HERNIA REPAIR     POLYPECTOMY  04/13/2017   Procedure: POLYPECTOMY;  Surgeon: Ruby Corporal, MD;  Location: [facility];  Service: Endoscopy;;  colon     No Known Allergies    Family History  Problem Relation Age of Onset   Heart attack Mother 9   Atrial fibrillation Brother      Social History [name] reports that he quit smoking about 21 years ago. His smoking use included cigarettes. He has never used smokeless tobacco. [name] reports no history of alcohol use.   Review of Systems   Physical Examination Today's Vitals   01/30/24 1115 01/30/24 1156  BP: (!) 164/82 (!) 154/85  Pulse: 74  SpO2: 94%   Weight: 212 lb (96.2 kg)   Height: 5\' 9"  (1.753 m)    Body mass index is 31.31 kg/m.   Gen: resting comfortably, no acute distress HEENT: no scleral icterus, pupils equal round and reactive, no palptable cervical adenopathy,  CV: RRR, no m/rg, no jvd Resp: Clear to auscultation bilaterally GI: abdomen is soft, non-tender, non-distended, normal bowel sounds, no hepatosplenomegaly MSK: extremities are warm, no edema.  Skin: warm, no rash Neuro:  no focal deficits Psych: appropriate affect   Assessment and Plan   1.PAF/acquired thrombophilia - fairly infrequent palpitations but can last hours when they occur - did not feel well on dilt 300mg  daily, has tolerated 240mg  daily - with his COPD will try bisoprolol 2.5mg , try initially just prn - continue eliquis for stroke prevention  - EKG today shows NSR   2. Elevated blood  pressure - elevated in clinic, at pulm appt in Feb was well controlled - he will update us  on home bp's in 1 week   3. LE edema - encouraged to take his prn lasix  more regularly, cut back on sodium intake.     Laurann Pollock, M.D.

## 2024-01-30 NOTE — Patient Instructions (Signed)
 Medication Instructions:  Take Bisoprolol 2.5 mg (1/2 tablet ) daily as needed for palpitations  Labwork: None today  Testing/Procedures: None today  Follow-Up: 6 months  Any Other Special Instructions Will Be Listed Below (If Applicable).   Take blood pressure daily for the next week and drop log off at clinic.  If you need a refill on your cardiac medications before your next appointment, please call your pharmacy.

## 2024-02-01 DIAGNOSIS — H353122 Nonexudative age-related macular degeneration, left eye, intermediate dry stage: Secondary | ICD-10-CM | POA: Diagnosis not present

## 2024-02-14 ENCOUNTER — Telehealth: Payer: Self-pay | Admitting: Cardiology

## 2024-02-14 ENCOUNTER — Encounter: Payer: Self-pay | Admitting: Cardiology

## 2024-02-14 NOTE — Telephone Encounter (Signed)
 error

## 2024-03-08 ENCOUNTER — Other Ambulatory Visit: Payer: Self-pay | Admitting: Internal Medicine

## 2024-03-28 ENCOUNTER — Other Ambulatory Visit: Payer: Self-pay | Admitting: Cardiology

## 2024-04-01 ENCOUNTER — Ambulatory Visit: Payer: Self-pay | Admitting: Cardiology

## 2024-04-21 NOTE — Progress Notes (Unsigned)
 Cole [name], male    DOB: 02/21/1946    MRN: [id number]  Brief patient profile:  25 yowm  quit smoking 2004 at onset  doe  referred to pulmonary clinic in [place]  11/03/2022 by Dr Shona  for sob / cough x 2020    Afib x around 2019   History of Present Illness  11/03/2022  Pulmonary/ 1st office eval/ Julies Carmickle / [place] [facility]  Chief Complaint  Patient presents with   Consult    COPD/ SOB/ Cough   Dyspnea:  last grocery store shopping 2lpm POC  Cough: worse at  hs min greenish after vigorous throat clearing  Sleep: sleeps on floor due to back/ 2 pillows  SABA use: neb 4 x daily not timed to take pre-exertion  02: only with exertion  none at hs  Rec Plan A = Automatic = Always=    Stiolto 2 puffs 1st thing each am and Pantoprazole  (protonix ) 40 mg   Take  30-60 min before first meal of the day and Pepcid  (famotidine )  20 mg after supper until return to office  Work on inhaler technique:   Plan B = Backup (to supplement plan A, not to replace it) Only use your albuterol  inhaler as a rescue medication Plan C = Crisis (instead of Plan B but only if Plan B stops working) - only use your albuterol -ipatropium nebulizer if you first try Plan B Also  Ok to try albuterol  15 min before an activity (on alternating days)  that you know would usually make you short of breath and see if it makes any difference and if makes none then don't take albuterol  after activity unless you can't catch your breath as this means it's the resting that helps, not the albuterol . Depomedrol 120 mg IM  Cough/ congestion >  400  mg up every 4 hours  - 1200 mg every 12 hours  GERD diet reviewed, bed blocks rec  Make sure you check your oxygen  saturation  AT  your highest level of activity (not after you stop)   to be sure it stays over 90%  Please schedule a follow up office visit in 4 weeks    05/11/2023  f/u ov/[place] [facility]/Cedrick Partain re: GOLD group B maint on stioto did  bring meds  Chief Complaint  Patient presents  with   COPD   Dyspnea:  weed eating in early am/ too hot to go MB Cough: very little  Sleeping: on floor x years x 2  SABA use: once or twice daily if outside in heat  02: uses inogen prn p ex  Rec My office will be contacting you by phone for referral to PFTs  - if possible prior to ov   Also  Ok to try albuterol  15 min before an activity (on alternating days between the albuterol  inhaler and one day do the nebulizer )  that you know would usually make you short of breath  Make sure you check your oxygen  saturation  AT  your highest level of activity (not after you stop)   to be sure it stays over 90%  Plan A = Automatic = Always=    stiolto 2 each (if bladder not getting better ok to drop to one puff each am  Plan B = Backup (to supplement plan A, not to replace it) Only use your albuterol  inhaler (RED) as a rescue medication  Plan C = Crisis (instead of Plan B but only if Plan B stops working) - only  use your albuterol  nebulizer if you first try Plan B   10/17/2023  f/u ov/[place] office/Enola Siebers re: GOLD group B  maint on stiolto   Chief Complaint  Patient presents with   Follow-up    Copd    Dyspnea:  walking lots more since last ov >>  some uphill to mb s stopping  Cough: none  Sleeping: on floor 2 pillows s  resp cc  SABA use: rare = few times a week 02: 2lpm at hs  Rec No change in medications  Make sure you check your oxygen  saturation  AT  your highest level of activity (not after you stop)   to be sure it stays over 90%   Please schedule a follow up visit in 6 months but call sooner if needed    04/22/2024  f/u ov/[place] office/Aizlynn Digilio re: 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   Chief Complaint  Patient presents with   COPD    Pft 12/19/23 DOE   Dyspnea:  6 months feels sob x one hour at 5 pm hasn't  thought about using rescue rx than as takes it up to q 4h anyway and doesn't check sats either then or with exertion as rec  Cough: not much  Sleeping: on floor with 2  pillows x years s    resp cc  SABA use: up q 4 hourss (due to  heat )hfa/ neb twice daily  02:  prn daytime  but not really using it   No obvious day to day or daytime variability or assoc excess/ purulent sputum or mucus plugs or hemoptysis or cp or chest tightness, subjective wheeze or overt  hb symptoms.    Also denies any obvious fluctuation of symptoms with weather or environmental changes or other aggravating or alleviating factors except as outlined above   No unusual exposure hx or h/o childhood pna/ asthma or knowledge of premature birth.  Current Allergies, Complete Past Medical History, Past Surgical History, Family History, and Social History were reviewed in [employer] record.  ROS  The following are not active complaints unless bolded Hoarseness, sore throat, dysphagia, dental problems, itching, sneezing,  nasal congestion or discharge of excess mucus or purulent secretions, ear ache,   fever, chills, sweats, unintended wt loss or wt gain, classically pleuritic or exertional cp,  orthopnea pnd or arm/hand swelling  or leg swelling, presyncope, palpitations, abdominal pain, anorexia, nausea, vomiting, diarrhea  or change in bowel habits or change in bladder habits, change in stools or change in urine, dysuria, hematuria,  rash, arthralgias, visual complaints, headache, numbness, weakness or ataxia or problems with walking or coordination,  change in mood or  memory.        Current Meds  Medication Sig   albuterol  (PROVENTIL ) (2.5 MG/3ML) 0.083% nebulizer solution USE 1 VIAL ( ) BY NEBULIZER EVERY 4 HOURS AS NEEDED FOR WHEEZING OR SHORTNESS OF BREATH   albuterol  (VENTOLIN  HFA) 108 (90 Base) MCG/ACT inhaler Inhale 2 puffs into the lungs every 6 (six) hours as needed for wheezing or shortness of breath.   bisoprolol  (ZEBETA ) 5 MG tablet Take 2.5 mg (1/2 tablet) daily as needed for palpitations   DILT-XR 240 MG 24 hr capsule Take 240 mg by mouth daily.    ELIQUIS 5 MG TABS tablet Take 5 mg by mouth 2 (two) times daily.   guaiFENesin  (MUCINEX ) 600 MG 12 hr tablet Take by mouth 2 (two) times daily.   losartan  (COZAAR ) 25 MG tablet TAKE  ONE TABLET BY MOUTH ONCE DAILY   STIOLTO RESPIMAT  2.5-2.5 MCG/ACT AERS INHALE TWO PUFFS ONCE DAILY   tamsulosin  (FLOMAX ) 0.4 MG CAPS capsule Take 0.4 mg by mouth daily.              Past Medical History:  Diagnosis Date   Atrial fibrillation (HCC)    BPH (benign prostatic hyperplasia)    COPD (chronic obstructive pulmonary disease) (HCC)    Hypertension        Objective:    Wts  04/22/2024       214  10/17/2023         208  05/11/2023       208   03/09/2023       207   12/05/22 204 lb 6.4 oz (92.7 kg)  11/03/22 209 lb (94.8 kg)  10/28/22 209 lb 3.2 oz (94.9 kg)    Vital signs reviewed  04/22/2024  - Note at rest 02 sats  94% on RA   General appearance:    amb mod obese (by BMI) wm nad     HEENT :  Oropharynx  clear   Nasal turbinates nl    NECK :  without JVD/Nodes/TM/ nl carotid upstrokes bilaterally   LUNGS: no acc muscle use,  Mod barrel  contour chest wall with bilateral  Distant bs s audible wheeze and  without cough on insp or exp maneuvers and mod  Hyperresonant  to  percussion bilaterally     CV:  RRR  no s3 or murmur or increase in P2, and  1+ pitting both LEs  ABD:  soft and nontender with pos mid insp Hoover's  in the supine position. No bruits or organomegaly appreciated, bowel sounds nl  MS:   Ext warm without deformities or   obvious joint restrictions , calf tenderness, cyanosis or clubbing  SKIN: warm and dry without lesions    NEURO:  alert, approp, nl sensorium with  no motor or cerebellar deficits apparent.           Assessment     Assessment & Plan COPD GOLD ?  group B Quit smoking 2004 with doe at that point and downhill ever since  - 11/03/2022  After extensive coaching inhaler device,  effectiveness =    80% with smi > start stiolto and more approp saba  -  11/03/2022   Walked on RA  x  2  lap(s) =  approx 300  ft  @ mod pace, stopped due to sob with lowest 02 sats 92%  -  05/11/2023  After extensive coaching inhaler device,  effectiveness =    75%  with smi/ hfa  so rec d/c duoneb and just use saba as rescue  - PFT's  12/19/23  FEV1 1.13 (39 % ) ratio 0.50  p 0 % improvement from saba p 0 prior to study with DLCO  9.44 (39%)   and FV curve classically concave      - 04/22/2024  After extensive coaching inhaler device,  effectiveness =    50% (not holding breath at all for hfa or smi   Still  Pt is Group B in terms of symptom/risk and laba/lama therefore appropriate rx at this point >>>  stiolto but use more approp saba(see below)  and track 02 sats more closely (see sep a/p):  Re SABA :  I spent extra time with pt today reviewing appropriate use of albuterol  for prn use on exertion with the following points: 1) saba  is for relief of sob that does not improve by walking a slower pace or resting but rather if the pt does not improve after trying this first. 2) If the pt is convinced, as many are, that saba helps recover from activity faster then it's easy to tell if this is the case by re-challenging : ie stop, take the inhaler, then p 5 minutes try the exact same activity (intensity of workload) that just caused the symptoms and see if they are substantially diminished or not after saba 3) if there is an activity that reproducibly causes the symptoms, try the saba 15 min before the activity on alternate days   If in fact the saba really does help, then fine to continue to use it prn but advised may need to look closer at the maintenance regimen being used to achieve better control of airways disease with exertion.     Exercise hypoxemia 03/09/2023   Walked on 3  x  3  lap(s) =  approx 450  ft  @ moderated pace, stopped due to end of study with lowest 02 sats 84% min sob  - 05/11/2023   Walked on RA  x  2  lap(s) =  approx 300  ft  @ mod pace, stopped due to  desat to 87%  > rec use of 02 if walking > 150 ft or much slower pace but no reason to use 02 post exertion   Again rec  Make sure you check your oxygen  saturation  AT  your highest level of activity (not after you stop)   to be sure it stays over 90% and adjust  02 flow upward to maintain this level if needed but remember to turn it back to previous settings when you stop (to conserve your supply).   Need for vaccination against Streptococcus pneumoniae  Leg swelling Refilled lasix  20 mg to take daily prn and f/u with PCP for further refills and monitoring of fluids status and BP  Discussed in detail all the  indications, usual  risks and alternatives  relative to the benefits with patient who agrees to proceed with Rx as outlined.         Need for vaccination with 20-polyvalent pneumococcal conjugate vaccine Given today      Each maintenance medication was reviewed in detail including emphasizing most importantly the difference between maintenance and prns and under what circumstances the prns are to be triggered using an action plan format where appropriate.  Total time for H and P, chart review, counseling, reviewing hfa/ smi/ 02 /pulse ox  device(s) and generating customized AVS unique to this office visit / same day charting = 30 min    Patient Instructions  Work on inhaler technique:  relax and gently blow all the way out then take a nice smooth full deep breath back in, triggering the inhaler at same time you start breathing in.  Hold breath in for at least  5 seconds if you can.  . Rinse and gargle with water  when done.  If mouth or throat bother you at all,  try brushing teeth/gums/tongue with arm and hammer toothpaste/ make a slurry and gargle and spit out.   Also  Ok to try albuterol  15 min before an activity (on alternating days between the inhaler/ then nebulizer then nothing)  that you know would usually make you short of breath and see if it makes any difference and if  makes none then don't take albuterol  after activity unless  you can't catch your breath as this means it's the resting that helps, not the albuterol .  Make sure you check your oxygen  saturation  AT  your highest level of activity (not after you stop)   to be sure it stays over 90% and adjust  02 flow upward to maintain this level if needed but remember to turn it back to previous settings when you stop (to conserve your supply).   For leg swelling furosemide  20 mg one daily as needed and follow up with Dr Shona   Please schedule a follow up office visit in 6 weeks, call sooner if needed            Ozell America, MD 04/22/2024

## 2024-04-22 ENCOUNTER — Encounter: Payer: Self-pay | Admitting: Internal Medicine

## 2024-04-22 ENCOUNTER — Ambulatory Visit: Admitting: Internal Medicine

## 2024-04-22 VITALS — BP 150/76 | HR 82 | Ht 69.0 in | Wt 214.6 lb

## 2024-04-22 DIAGNOSIS — Z23 Encounter for immunization: Secondary | ICD-10-CM | POA: Insufficient documentation

## 2024-04-22 DIAGNOSIS — J449 Chronic obstructive pulmonary disease, unspecified: Secondary | ICD-10-CM

## 2024-04-22 DIAGNOSIS — M7989 Other specified soft tissue disorders: Secondary | ICD-10-CM | POA: Insufficient documentation

## 2024-04-22 DIAGNOSIS — R0902 Hypoxemia: Secondary | ICD-10-CM

## 2024-04-22 MED ORDER — FUROSEMIDE 20 MG PO TABS: 20.0000 mg | ORAL_TABLET | Freq: Every day | ORAL | 1 refills | Status: DC | PRN

## 2024-04-22 NOTE — Assessment & Plan Note (Addendum)
 Given today.

## 2024-04-22 NOTE — Assessment & Plan Note (Addendum)
 Quit smoking 2004 with doe at that point and downhill ever since  - 11/03/2022  After extensive coaching inhaler device,  effectiveness =    80% with smi > start stiolto and more approp saba  - 11/03/2022   Walked on RA  x  2  lap(s) =  approx 300  ft  @ mod pace, stopped due to sob with lowest 02 sats 92%  -  05/11/2023  After extensive coaching inhaler device,  effectiveness =    75%  with smi/ hfa  so rec d/c duoneb and just use saba as rescue  - PFT's  12/19/23  FEV1 1.13 (39 % ) ratio 0.50  p 0 % improvement from saba p 0 prior to study with DLCO  9.44 (39%)   and FV curve classically concave      - 04/22/2024  After extensive coaching inhaler device,  effectiveness =    50% (not holding breath at all for hfa or smi   Still  Pt is Group B in terms of symptom/risk and laba/lama therefore appropriate rx at this point >>>  stiolto but use more approp saba(see below)  and track 02 sats more closely (see sep a/p):  Re SABA :  I spent extra time with pt today reviewing appropriate use of albuterol  for prn use on exertion with the following points: 1) saba is for relief of sob that does not improve by walking a slower pace or resting but rather if the pt does not improve after trying this first. 2) If the pt is convinced, as many are, that saba helps recover from activity faster then it's easy to tell if this is the case by re-challenging : ie stop, take the inhaler, then p 5 minutes try the exact same activity (intensity of workload) that just caused the symptoms and see if they are substantially diminished or not after saba 3) if there is an activity that reproducibly causes the symptoms, try the saba 15 min before the activity on alternate days   If in fact the saba really does help, then fine to continue to use it prn but advised may need to look closer at the maintenance regimen being used to achieve better control of airways disease with exertion.

## 2024-04-22 NOTE — Patient Instructions (Addendum)
 Work on inhaler technique:  relax and gently blow all the way out then take a nice smooth full deep breath back in, triggering the inhaler at same time you start breathing in.  Hold breath in for at least  5 seconds if you can.  . Rinse and gargle with water  when done.  If mouth or throat bother you at all,  try brushing teeth/gums/tongue with arm and hammer toothpaste/ make a slurry and gargle and spit out.   Also  Ok to try albuterol  15 min before an activity (on alternating days between the inhaler/ then nebulizer then nothing)  that you know would usually make you short of breath and see if it makes any difference and if makes none then don't take albuterol  after activity unless you can't catch your breath as this means it's the resting that helps, not the albuterol .  Make sure you check your oxygen  saturation  AT  your highest level of activity (not after you stop)   to be sure it stays over 90% and adjust  02 flow upward to maintain this level if needed but remember to turn it back to previous settings when you stop (to conserve your supply).   For leg swelling furosemide  20 mg one daily as needed and follow up with Dr Shona   Please schedule a follow up office visit in 6 weeks, call sooner if needed

## 2024-04-22 NOTE — Assessment & Plan Note (Addendum)
 Refilled lasix  20 mg to take daily prn and f/u with PCP for further refills and monitoring of fluids status and BP  Discussed in detail all the  indications, usual  risks and alternatives  relative to the benefits with patient who agrees to proceed with Rx as outlined.

## 2024-04-22 NOTE — Assessment & Plan Note (Addendum)
 03/09/2023   Walked on 3  x  3  lap(s) =  approx 450  ft  @ moderated pace, stopped due to end of study with lowest 02 sats 84% min sob  - 05/11/2023   Walked on RA  x  2  lap(s) =  approx 300  ft  @ mod pace, stopped due to desat to 87%  > rec use of 02 if walking > 150 ft or much slower pace but no reason to use 02 post exertion   Again rec  Make sure you check your oxygen  saturation  AT  your highest level of activity (not after you stop)   to be sure it stays over 90% and adjust  02 flow upward to maintain this level if needed but remember to turn it back to previous settings when you stop (to conserve your supply).

## 2024-05-17 DIAGNOSIS — R7303 Prediabetes: Secondary | ICD-10-CM | POA: Diagnosis not present

## 2024-05-17 DIAGNOSIS — R972 Elevated prostate specific antigen [PSA]: Secondary | ICD-10-CM | POA: Diagnosis not present

## 2024-05-17 DIAGNOSIS — E785 Hyperlipidemia, unspecified: Secondary | ICD-10-CM | POA: Diagnosis not present

## 2024-05-28 ENCOUNTER — Encounter: Payer: Self-pay | Admitting: Internal Medicine

## 2024-05-28 DIAGNOSIS — R7303 Prediabetes: Secondary | ICD-10-CM | POA: Diagnosis not present

## 2024-05-28 DIAGNOSIS — I482 Chronic atrial fibrillation, unspecified: Secondary | ICD-10-CM | POA: Diagnosis not present

## 2024-05-28 DIAGNOSIS — K219 Gastro-esophageal reflux disease without esophagitis: Secondary | ICD-10-CM | POA: Diagnosis not present

## 2024-05-28 DIAGNOSIS — R972 Elevated prostate specific antigen [PSA]: Secondary | ICD-10-CM | POA: Diagnosis not present

## 2024-05-28 DIAGNOSIS — J449 Chronic obstructive pulmonary disease, unspecified: Secondary | ICD-10-CM | POA: Diagnosis not present

## 2024-05-28 DIAGNOSIS — R600 Localized edema: Secondary | ICD-10-CM | POA: Diagnosis not present

## 2024-05-28 DIAGNOSIS — I10 Essential (primary) hypertension: Secondary | ICD-10-CM | POA: Diagnosis not present

## 2024-05-28 DIAGNOSIS — E785 Hyperlipidemia, unspecified: Secondary | ICD-10-CM | POA: Diagnosis not present

## 2024-05-28 DIAGNOSIS — N400 Enlarged prostate without lower urinary tract symptoms: Secondary | ICD-10-CM | POA: Diagnosis not present

## 2024-06-01 NOTE — Progress Notes (Unsigned)
 Cole Cole [name], male    DOB: 1946/03/18    MRN: [id number]  Brief patient profile:  74 yowm  quit smoking 2004 at onset  doe  referred to pulmonary clinic in [place]  11/03/2022 by Dr Cole [name]  for sob / cough x 2020    Afib x around 2019   History of Present Illness  11/03/2022  Pulmonary/ 1st Cole [name] eval/ Cole Cole [name] / Cole Cole [name]  Chief Complaint  Patient presents with   Consult    COPD/ SOB/ Cough   Dyspnea:  last grocery store shopping 2lpm POC  Cough: worse at  hs min greenish after vigorous throat clearing  Sleep: sleeps on floor due to back/ 2 pillows  SABA use: neb 4 x daily not timed to take pre-exertion  02: only with exertion  none at hs  Rec Plan A = Automatic = Always=    Stiolto 2 puffs 1st thing each am and Pantoprazole  (protonix ) 40 mg   Take  30-60 min before first meal of the day and Pepcid  (famotidine )  20 mg after supper until return to Cole [name]  Work on inhaler technique:   Plan B = Backup (to supplement plan A, not to replace it) Only use your albuterol  inhaler as a rescue medication Plan C = Crisis (instead of Plan B but only if Plan B stops working) - only use your albuterol -ipatropium nebulizer if you first try Plan B Also  Ok to try albuterol  15 min before an activity (on alternating days)  that you know would usually make you short of breath and see if it makes any difference and if makes none then don't take albuterol  after activity unless you can't catch your breath as this means it's the resting that helps, not the albuterol . Depomedrol 120 mg IM  Cough/ congestion >  400  mg up every 4 hours  - 1200 mg every 12 hours  GERD diet reviewed, bed blocks rec  Make sure you check your oxygen  saturation  AT  your highest level of activity (not after you stop)   to be sure it stays over 90%  Please schedule a follow up Cole [name] visit in 4 weeks    05/11/2023  f/u ov/Cole Cole [name]/Cole Cole [name] re: GOLD group B maint on stioto did  bring meds  Chief Complaint  Patient presents  with   COPD   Dyspnea:  weed eating in early am/ too hot to go MB Cough: very little  Sleeping: on floor x years x 2  SABA use: once or twice daily if outside in heat  02: uses inogen prn p ex  Rec My Cole [name] will be contacting you by phone for referral to PFTs  - if possible prior to ov   Also  Ok to try albuterol  15 min before an activity (on alternating days between the albuterol  inhaler and one day do the nebulizer )  that you know would usually make you short of breath  Make sure you check your oxygen  saturation  AT  your highest level of activity (not after you stop)   to be sure it stays over 90%  Plan A = Automatic = Always=    stiolto 2 each (if bladder not getting better ok to drop to one puff each am  Plan B = Backup (to supplement plan A, not to replace it) Only use your albuterol  inhaler (RED) as a rescue medication  Plan C = Crisis (instead of Plan B but only if Plan B stops working) - only  use your albuterol  nebulizer if you first try Plan B   10/17/2023  f/u ov/Cole Cole [name]/Cole Cole [name] re: GOLD group B  maint on stiolto   Chief Complaint  Patient presents with   Follow-up    Copd    Dyspnea:  walking lots more since last ov >>  some uphill to mb s stopping  Cough: none  Sleeping: on floor 2 pillows s  resp cc  SABA use: rare = few times a week 02: 2lpm at hs  Rec No change in medications  Make sure you check your oxygen  saturation  AT  your highest level of activity (not after you stop)   to be sure it stays over 90%   Please schedule a follow up visit in 6 months but call sooner if needed    04/22/2024  f/u ov/Cole Cole [name]/Cole Cole [name] re: 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   Dyspnea:  *** Cough: *** Sleeping: ***   resp cc  SABA use: *** 02: ***  Lung cancer screening: ***   No obvious day to day or daytime variability or assoc excess/ purulent sputum or mucus plugs or hemoptysis or cp or chest tightness, subjective wheeze or overt sinus or hb symptoms.    Also denies any obvious fluctuation of symptoms with weather or environmental changes or other aggravating or alleviating factors except as outlined above   No unusual exposure hx or h/o childhood pna/ asthma or knowledge of premature birth.  Current Allergies, Complete Past Medical History, Past Surgical History, Family History, and Social History were reviewed in [employer] record.  ROS  The following are not active complaints unless bolded Hoarseness, sore throat, dysphagia, dental problems, itching, sneezing,  nasal congestion or discharge of excess mucus or purulent secretions, ear ache,   fever, chills, sweats, unintended wt loss or wt gain, classically pleuritic or exertional cp,  orthopnea pnd or arm/hand swelling  or leg swelling, presyncope, palpitations, abdominal pain, anorexia, nausea, vomiting, diarrhea  or change in bowel habits or change in bladder habits, change in stools or change in urine, dysuria, hematuria,  rash, arthralgias, visual complaints, headache, numbness, weakness or ataxia or problems with walking or coordination,  change in mood or  memory.        No outpatient medications have been marked as taking for the 06/03/24 encounter (Appointment) with Darlean Ozell NOVAK, MD.             Past Medical History:  Diagnosis Date   Atrial fibrillation [facility])    BPH (benign prostatic hyperplasia)    COPD  (chronic obstructive pulmonary disease) (HCC)    Hypertension        Objective:    Wts  06/03/2024        ***  04/22/2024       214  10/17/2023         208  05/11/2023       208   03/09/2023       207   12/05/22 204 lb 6.4 oz (92.7 kg)  11/03/22 209 lb (94.8 kg)  10/28/22 209 lb 3.2 oz (94.9 kg)    Vital signs reviewed  06/03/2024  - Note at rest 02 sats  ***% on ***   General appearance:    ***   Mod barrel   1+ pitting both LEs***  .           Assessment

## 2024-06-03 ENCOUNTER — Ambulatory Visit: Admitting: Internal Medicine

## 2024-06-03 ENCOUNTER — Encounter: Payer: Self-pay | Admitting: Internal Medicine

## 2024-06-03 VITALS — BP 128/72 | HR 85 | Ht 69.0 in | Wt 214.0 lb

## 2024-06-03 DIAGNOSIS — J449 Chronic obstructive pulmonary disease, unspecified: Secondary | ICD-10-CM

## 2024-06-03 DIAGNOSIS — R0902 Hypoxemia: Secondary | ICD-10-CM

## 2024-06-03 NOTE — Patient Instructions (Signed)
 Work on inhaler technique:  relax and gently blow all the way out then take a nice smooth full deep breath back in, triggering the inhaler at same time you start breathing in.  Hold breath in for at least  5 seconds if you can. . Rinse and gargle with water  when done.  If mouth or throat bother you at all,  try brushing teeth/gums/tongue with arm and hammer toothpaste/ make a slurry and gargle and spit out.   We will walk you today to be sure you have adequate 02  with ex  Make sure you check your oxygen  saturation  AT  your highest level of activity (not after you stop)   to be sure it stays over 90% and adjust  02 flow upward to maintain this level if needed but remember to turn it back to previous settings when you stop (to conserve your supply).    Please schedule a follow up visit in 3 months but call sooner if needed

## 2024-06-03 NOTE — Assessment & Plan Note (Addendum)
 Quit smoking 2004 with doe at that point and downhill ever since  - 11/03/2022  After extensive coaching inhaler device,  effectiveness =    80% with smi > start stiolto and more approp saba  - 11/03/2022   Walked on RA  x  2  lap(s) =  approx 300  ft  @ mod pace, stopped due to sob with lowest 02 sats 92%  -  05/11/2023  After extensive coaching inhaler device,  effectiveness =    75%  with smi/ hfa  so rec d/c duoneb and just use saba as rescue  - PFT's  12/19/23  FEV1 1.13 (39 % ) ratio 0.50  p 0 % improvement from saba p 0 prior to study with DLCO  9.44 (39%)   and FV curve classically concave      - 04/22/2024  After extensive coaching inhaler device,  effectiveness =    50% (not holding breath at all for hfa or smi  - 06/03/2024  After extensive coaching inhaler device,  effectiveness =   90% smi and 80% hfa  from baseline < 50% (too short ti, too short breath hold  Pt is Group B in terms of symptom/risk and laba/lama therefore appropriate rx at this point >>>  stiolto plus better smi techniqu and more approp saba Re SABA :  I spent extra time with pt today reviewing appropriate use of albuterol  for prn use on exertion with the following points: 1) saba is for relief of sob that does not improve by walking a slower pace or resting but rather if the pt does not improve after trying this first. 2) If the pt is convinced, as many are, that saba helps recover from activity faster then it's easy to tell if this is the case by re-challenging : ie stop, take the inhaler, then p 5 minutes try the exact same activity (intensity of workload) that just caused the symptoms and see if they are substantially diminished or not after saba 3) if there is an activity that reproducibly causes the symptoms, try the saba 15 min before the activity on alternate days   If in fact the saba really does help, then fine to continue to use it prn but advised may need to look closer at the maintenance regimen being used to achieve  better control of airways disease with exertion.

## 2024-06-03 NOTE — Assessment & Plan Note (Addendum)
 03/09/2023   Walked on 3  x  3  lap(s) =  approx 450  ft  @ moderated pace, stopped due to end of study with lowest 02 sats 84% min sob  - 05/11/2023   Walked on RA  x  2  lap(s) =  approx 300  ft  @ mod pace, stopped due to desat to 87%  > rec use of 02 if walking > 150 ft or much slower pace but no reason to use 02 post exertion   - 06/03/2024   Walked on RA  x  1  lap(s) =  approx 150  ft  @ mod pace, stopped due to sob  with lowest 02 sats 92%

## 2024-06-06 ENCOUNTER — Other Ambulatory Visit: Payer: Self-pay | Admitting: Internal Medicine

## 2024-07-08 DIAGNOSIS — J449 Chronic obstructive pulmonary disease, unspecified: Secondary | ICD-10-CM | POA: Diagnosis not present

## 2024-07-08 DIAGNOSIS — I10 Essential (primary) hypertension: Secondary | ICD-10-CM | POA: Diagnosis not present

## 2024-07-08 DIAGNOSIS — R040 Epistaxis: Secondary | ICD-10-CM | POA: Diagnosis not present

## 2024-07-08 DIAGNOSIS — K5900 Constipation, unspecified: Secondary | ICD-10-CM | POA: Diagnosis not present

## 2024-08-14 DIAGNOSIS — H01003 Unspecified blepharitis right eye, unspecified eyelid: Secondary | ICD-10-CM | POA: Diagnosis not present

## 2024-08-23 ENCOUNTER — Other Ambulatory Visit (HOSPITAL_COMMUNITY): Payer: Self-pay | Admitting: Family Medicine

## 2024-08-23 ENCOUNTER — Ambulatory Visit (HOSPITAL_COMMUNITY)
Admission: RE | Admit: 2024-08-23 | Discharge: 2024-08-23 | Disposition: A | Source: Ambulatory Visit | Attending: Family Medicine | Admitting: Family Medicine

## 2024-08-23 DIAGNOSIS — R0602 Shortness of breath: Secondary | ICD-10-CM

## 2024-08-29 ENCOUNTER — Ambulatory Visit: Admitting: Internal Medicine

## 2024-08-29 NOTE — Progress Notes (Deleted)
 Cole [name], male    DOB: 12-26-1945    MRN: [id number]  Brief patient profile:  72 yowm  quit smoking 2004 at onset  doe  referred to pulmonary clinic in [place]  11/03/2022 by Dr Shona  for sob / cough x 2020    Afib x around 2019   History of Present Illness  11/03/2022  Pulmonary/ 1st office eval/ Cole [name] / [place] [facility]  Chief Complaint  Patient presents with   Consult    COPD/ SOB/ Cough   Dyspnea:  last grocery store shopping 2lpm POC  Cough: worse at  hs min greenish after vigorous throat clearing  Sleep: sleeps on floor due to back/ 2 pillows  SABA use: neb 4 x daily not timed to take pre-exertion  02: only with exertion  none at hs  Rec Plan A = Automatic = Always=    Stiolto 2 puffs 1st thing each am and Pantoprazole  (protonix ) 40 mg   Take  30-60 min before first meal of the day and Pepcid  (famotidine )  20 mg after supper until return to office  Work on inhaler technique:   Plan B = Backup (to supplement plan A, not to replace it) Only use your albuterol  inhaler as a rescue medication Plan C = Crisis (instead of Plan B but only if Plan B stops working) - only use your albuterol -ipatropium nebulizer if you first try Plan B Also  Ok to try albuterol  15 min before an activity (on alternating days)  that you know would usually make you short of breath and see if it makes any difference and if makes none then don't take albuterol  after activity unless you can't catch your breath as this means it's the resting that helps, not the albuterol . 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   Chief Complaint  Patient presents with   COPD    Pft 12/19/23 DOE   Dyspnea:  6 months feels sob x one hour at 5 pm hasn't  thought about using rescue rx than as takes it up to q 4h anyway and doesn't check sats either then or with exertion as rec  Cough: not much  Sleeping: on floor with 2 pillows x years s    resp cc  SABA use: up q 4 hourss (due to  heat )hfa/ neb twice daily  02:  prn daytime  but not really using it Rec Work on inhaler technique:    Also  Ok to try albuterol  15 min before an activity (on alternating days between the inhaler/ then nebulizer then nothing)  that you know would usually make you short of breath   Make sure you check your oxygen  saturation  AT  your highest level of activity (not after you  stop)   to be sure it stays over 90%   For leg swelling furosemide  20 mg one daily as needed and follow up with Dr Shona   Please schedule a follow up office visit in 6 weeks, call sooner if needed    06/03/2024  f/u ov/[place] office/Cole [name] re: COPD 3 GOLD group B  maint on stiolto    Cc doe no better, esp in heat/ humidity  Dyspnea:  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, does not not check sats  Cough: lots of am congesiton > clear  Sleeping: on floor no mattress x 2 pillows  s  resp cc  SABA use: 4-6 x per day never prechallenges and neb qonoct  02: just prn   Patient Instructions  Work on inhaler technique:   We will walk you today to be sure you have adequate 02  with ex Make sure you check your oxygen  saturation  AT  your highest level of activity (not after you stop)   to be sure it stays over 90%    Please schedule a follow up visit in 3 months but call sooner if needed   08/29/2024  f/u ov/[place] office/Cole [name] re: COPD 3 GOLD group B  maint on ***  No chief complaint on file.    Dyspnea:  *** Cough: *** Sleeping: ***   resp cc  SABA use: *** 02: ***  Lung cancer screening: ***   No obvious day to day or daytime variability or assoc excess/ purulent sputum or mucus plugs or hemoptysis or cp or chest tightness, subjective wheeze or overt sinus or hb symptoms.    Also denies any obvious fluctuation of symptoms with weather or environmental changes or other aggravating or alleviating factors except as outlined above   No unusual exposure hx or h/o childhood pna/ asthma or knowledge of premature birth.  Current Allergies, Complete Past Medical History, Past Surgical History, Family History, and Social History were reviewed in [employer] record.  ROS  The following are not active complaints unless bolded Hoarseness, sore throat, dysphagia, dental problems, itching, sneezing,  nasal congestion or discharge of excess mucus or purulent secretions, ear ache,   fever, chills, sweats, unintended wt loss or wt gain, classically pleuritic or exertional cp,  orthopnea pnd or arm/hand swelling  or leg swelling, presyncope, palpitations, abdominal pain, anorexia, nausea, vomiting, diarrhea  or change in bowel habits or change in bladder habits, change in stools or change in urine, dysuria, hematuria,  rash, arthralgias, visual complaints, headache, numbness, weakness or ataxia or problems with walking or coordination,  change in mood or  memory.         Outpatient Medications Prior to Visit  Medication Sig Dispense Refill   albuterol  (PROVENTIL ) (2.5 MG/3ML) 0.083% nebulizer solution USE 1 VIAL ( ) BY NEBULIZER EVERY 4 HOURS AS NEEDED FOR WHEEZING OR SHORTNESS OF BREATH 75 mL 12   albuterol  (VENTOLIN  HFA) 108 (90 Base) MCG/ACT inhaler Inhale 2 puffs into the lungs every 6 (six) hours as needed for wheezing or shortness of breath. 18 g 3   bisoprolol  (ZEBETA ) 5 MG tablet Take 2.5 mg (1/2 tablet) daily as needed for palpitations 30 tablet 1   DILT-XR 240  MG 24 hr capsule Take 240 mg by mouth daily.     ELIQUIS 5 MG TABS tablet Take 5 mg by mouth 2 (two) times daily.     furosemide  (LASIX ) 20 MG tablet TAKE ONE TABLET BY MOUTH ONCE DAILY AS NEEDED  30 tablet 1   guaiFENesin  (MUCINEX ) 600 MG 12 hr tablet Take by mouth 2 (two) times daily. (Patient not taking: Reported on 06/03/2024)     losartan  (COZAAR ) 25 MG tablet TAKE ONE TABLET BY MOUTH ONCE DAILY 90 tablet 3   STIOLTO RESPIMAT  2.5-2.5 MCG/ACT AERS INHALE TWO PUFFS ONCE DAILY 4 g 11   tamsulosin  (FLOMAX ) 0.4 MG CAPS capsule Take 0.4 mg by mouth daily.      No facility-administered medications prior to visit.            Past Medical History:  Diagnosis Date   Atrial fibrillation (HCC)    BPH (benign prostatic hyperplasia)    COPD (chronic obstructive pulmonary disease) (HCC)    Hypertension        Objective:    Wts  08/29/2024     ***  06/03/2024       214   04/22/2024       214  10/17/2023         208  05/11/2023       208   03/09/2023       207   12/05/22 204 lb 6.4 oz (92.7 kg)  11/03/22 209 lb (94.8 kg)  10/28/22 209 lb 3.2 oz (94.9 kg)    Vital signs reviewed  08/29/2024  - Note at rest 02 sats  ***% on ***   General appearance:    ***      trace pitting only both LEs***            Assessment

## 2024-09-02 ENCOUNTER — Other Ambulatory Visit: Payer: Self-pay

## 2024-09-02 ENCOUNTER — Telehealth: Payer: Self-pay | Admitting: Cardiology

## 2024-09-02 NOTE — Telephone Encounter (Signed)
 Wife says they saw a provider in Dr.Halls office today and she had been following him since last week for leg swelling, Weight today was 218 lbs-down from 220 lbs last week.  Wife reposrts HR at PCP office was 120 per machine, at home their machine has BP 133/92, HR 110  Patient is without any complaints accept SOB when walking long distances which  he says is nothing new.  PCP has him taking Lasix  20 mg BID and Potassium 10 meq daily since last week.   Wife confirmed he is taking diltiazem  240 mg and losartan  25 mg qd  He has no c/o palpitations but will take Bisoprolol  2.5 mg now.  Offered appointment in office if one becomes available but wife wants to wait until after the holidays    Please advise

## 2024-09-02 NOTE — Telephone Encounter (Signed)
 Pt called in requesting appt for elevated hr at doctor office. Did not have readings to give. STAT if HR is under 50 or over 120  (normal HR is 60-100 beats per minute)  What is your heart rate? N/a  Do you have a log of your heart rate readings (document readings)? no  Do you have any other symptoms? Weight gain

## 2024-09-03 NOTE — Telephone Encounter (Signed)
 Patient  accepted appointment for 09/06/24 at 2:40 pm with Dr.Branch in the [place] office

## 2024-09-06 ENCOUNTER — Ambulatory Visit: Admitting: Cardiology

## 2024-09-09 ENCOUNTER — Other Ambulatory Visit: Payer: Self-pay | Admitting: Cardiology

## 2024-09-09 ENCOUNTER — Ambulatory Visit: Admitting: Internal Medicine

## 2024-09-16 ENCOUNTER — Other Ambulatory Visit: Payer: Self-pay | Admitting: Internal Medicine

## 2024-09-30 ENCOUNTER — Encounter: Payer: Self-pay | Admitting: Cardiology

## 2024-09-30 ENCOUNTER — Ambulatory Visit: Attending: Cardiology | Admitting: Cardiology

## 2024-09-30 VITALS — BP 102/65 | HR 94 | Ht 70.0 in | Wt 215.8 lb

## 2024-09-30 DIAGNOSIS — Z79899 Other long term (current) drug therapy: Secondary | ICD-10-CM | POA: Diagnosis not present

## 2024-09-30 DIAGNOSIS — R600 Localized edema: Secondary | ICD-10-CM | POA: Diagnosis not present

## 2024-09-30 DIAGNOSIS — D6869 Other thrombophilia: Secondary | ICD-10-CM

## 2024-09-30 DIAGNOSIS — I480 Paroxysmal atrial fibrillation: Secondary | ICD-10-CM | POA: Diagnosis not present

## 2024-09-30 MED ORDER — POTASSIUM CHLORIDE ER 10 MEQ PO TBCR: 10.0000 meq | EXTENDED_RELEASE_TABLET | Freq: Every day | ORAL | 1 refills | Status: AC

## 2024-09-30 MED ORDER — FUROSEMIDE 40 MG PO TABS: 40.0000 mg | ORAL_TABLET | Freq: Every day | ORAL | 3 refills | Status: AC

## 2024-09-30 NOTE — Patient Instructions (Signed)
 Medication Instructions:  Your physician has recommended you make the following change in your medication:   -Increase Lasix  to 40 mg once daily   *If you need a refill on your cardiac medications before your next appointment, please call your pharmacy*  Lab Work: BMP MAG  If you have labs (blood work) drawn today and your tests are completely normal, you will receive your results only by: MyChart Message (if you have MyChart) OR A paper copy in the mail If you have any lab test that is abnormal or we need to change your treatment, we will call you to review the results.  Testing/Procedures: None  Follow-Up: At [facility], you and your health needs are our priority.  As part of our continuing mission to provide you with exceptional heart care, our providers are all part of one team.  This team includes your primary Cardiologist (physician) and Advanced Practice Providers or APPs (Physician Assistants and Nurse Practitioners) who all work together to provide you with the care you need, when you need it.  Your next appointment:   3-4 week(s)  Provider:   You may see Dorn Ross, MD or one of the following Advanced Practice Providers on your designated Care Team:   Laymon Qua, PA-C  [place], [place] Olivia [place], [place]     We recommend signing up for the patient portal called MyChart.  Sign up information is provided on this After Visit Summary.  MyChart is used to connect with patients for Virtual Visits (Telemedicine).  Patients are able to view lab/test results, encounter notes, upcoming appointments, etc.  Non-urgent messages can be sent to your provider as well.   To learn more about what you can do with MyChart, go to [email or web address].   Other Instructions Thank you for choosing [facility]!

## 2024-09-30 NOTE — Progress Notes (Signed)
 "     Clinical Summary Cole [name] is a 79 y.o.male seen today for follow up of the following medical problems.    1.PAF - new diagnosis during 12/2016 admission, self converted to SR that admission - 12/2022 30 day monitor: primarily SR, rare PAF.    - did not feel well on dilt 300, tolerates 240mg  daily   - some palpitations, about once every 3 weeks. Can last several hours. Can check HR and can be up to 130s - no bleeding on eliquis.   - last visit added bisoprolol  2.5mg  daily Rare palpitations, improved with bisoprolol .  - no bleeding on eliquis   2.DOE 11/2022 echo: LVEF 55-60%, grade I dd, normal RV, normal PASP,  - chronic SOB, has home O2 as needed. Usually uses 2L     3. COPD on home O2 - compliant with inhalers - followed by Dr Darlean   4. Elevated blood pressure - bp elevated in clinic today   5. LE edema -11/2022 echo LVEF 55-60%, grade I dd, normal RV function - significant increase in his LE edema - takings lasix  20mg  bid Past Medical History:  Diagnosis Date   Atrial fibrillation (HCC)    BPH (benign prostatic hyperplasia)    COPD (chronic obstructive pulmonary disease) (HCC)    Hypertension      Allergies[1]   Current Outpatient Medications  Medication Sig Dispense Refill   albuterol  (PROVENTIL ) (2.5 MG/3ML) 0.083% nebulizer solution USE 1 VIAL ( ) BY NEBULIZER EVERY 4 HOURS AS NEEDED FOR WHEEZING OR SHORTNESS OF BREATH 75 mL 12   albuterol  (VENTOLIN  HFA) 108 (90 Base) MCG/ACT inhaler Inhale 2 puffs into the lungs every 6 (six) hours as needed for wheezing or shortness of breath. 18 g 3   bisoprolol  (ZEBETA ) 5 MG tablet TAKE 1/2 TABLET BY MOUTH DAILY AS NEEDED FOR PALPITATIONS 30 tablet 3   DILT-XR 240 MG 24 hr capsule Take 240 mg by mouth daily.     ELIQUIS 5 MG TABS tablet Take 5 mg by mouth 2 (two) times daily.     furosemide  (LASIX ) 20 MG tablet TAKE ONE TABLET BY MOUTH ONCE DAILY AS NEEDED 30 tablet 1   guaiFENesin  (MUCINEX ) 600 MG 12 hr tablet  Take by mouth 2 (two) times daily. (Patient not taking: Reported on 06/03/2024)     losartan  (COZAAR ) 25 MG tablet TAKE ONE TABLET BY MOUTH ONCE DAILY 90 tablet 3   STIOLTO RESPIMAT  2.5-2.5 MCG/ACT AERS INHALE TWO PUFFS ONCE DAILY 4 g 11   tamsulosin  (FLOMAX ) 0.4 MG CAPS capsule Take 0.4 mg by mouth daily.      No current facility-administered medications for this visit.     Past Surgical History:  Procedure Laterality Date   COLONOSCOPY N/A 04/13/2017   Procedure: COLONOSCOPY;  Surgeon: Golda Claudis PENNER, MD;  Location: [facility];  Service: Endoscopy;  Laterality: N/A;  12:00   HERNIA REPAIR     POLYPECTOMY  04/13/2017   Procedure: POLYPECTOMY;  Surgeon: Golda Claudis PENNER, MD;  Location: [facility];  Service: Endoscopy;;  colon     Allergies[2]    Family History  Problem Relation Age of Onset   Heart attack Mother 28   Atrial fibrillation Brother      Social History Mr. Londo reports that he quit smoking about 22 years ago. His smoking use included cigarettes. He has never used smokeless tobacco. Mr. Stanco reports no history of alcohol use.    Physical Examination Today's Vitals   09/30/24  1328  BP: 102/65  Pulse: 94  SpO2: 91%  Weight: 215 lb 12.8 oz (97.9 kg)  Height: 5' 10 (1.778 m)   Body mass index is 30.96 kg/m.  Gen: resting comfortably, no acute distress HEENT: no scleral icterus, pupils equal round and reactive, no palptable cervical adenopathy,  CV: irreg, no mrg, no jvd Resp: Clear to auscultation bilaterally GI: abdomen is soft, non-tender, non-distended, normal bowel sounds, no hepatosplenomegaly MSK: extremities are warm, 2+ bilateral LE edema Skin: warm, no rash Neuro:  no focal deficits Psych: appropriate affect    Assessment and Plan   1.PAF/acquired thrombophilia - overall doing well, continue current meds incluiding eliquis for stroke prevention   2. LE edema - increased edema, will change lasix  to 40mg  bid. Check bmet/mg in 2 weeks,  f/u 3-4 weeks - pending clinical course may repeat echo  F/u 3-4 weeks     Dorn PHEBE Ross, M.D.     [1] No Known Allergies [2] No Known Allergies  "

## 2024-10-02 ENCOUNTER — Ambulatory Visit: Admitting: Internal Medicine

## 2024-10-02 DIAGNOSIS — R0902 Hypoxemia: Secondary | ICD-10-CM

## 2024-10-02 DIAGNOSIS — J449 Chronic obstructive pulmonary disease, unspecified: Secondary | ICD-10-CM

## 2024-10-02 NOTE — Progress Notes (Unsigned)
 "   Cole Cole [name], male    DOB: 08/27/1946    MRN: [id number]  Brief patient profile:  82 yowm  quit smoking 2004 at onset  doe  referred to pulmonary clinic in [name]  11/03/2022 by Dr Cole [name]  for sob / cough x 2020    Afib x around 2019   History of Present Illness  11/03/2022  Pulmonary/ 1st Cole [name] eval/ [facility] Cole [name] / [place] [name]  Chief Complaint  Patient presents with   Consult    COPD/ SOB/ Cough   Dyspnea:  last grocery store shopping 2lpm POC  Cough: worse at  hs min greenish after vigorous throat clearing  Sleep: sleeps on floor due to back/ 2 pillows  SABA use: neb 4 x daily not timed to take pre-exertion  02: only with exertion  none at hs  Rec Plan A = Automatic = Always=    Stiolto 2 puffs 1st thing each am and Pantoprazole  (protonix ) 40 mg   Take  30-60 min before first meal of the day and Pepcid  (famotidine )  20 mg after supper until return to [place] [name]  Work on inhaler technique:   Plan B = Backup (to supplement plan A, not to replace it) Only use your albuterol  inhaler as a rescue medication Plan C = Crisis (instead of Plan B but only if Plan B stops working) - only use your albuterol -ipatropium nebulizer if you first try Plan B Also  Ok to try albuterol  15 min before an activity (on alternating days)  that you know would usually make you short of breath and see if it makes any difference and if makes none then don't take albuterol  after activity unless you can't catch your breath as this means it's the resting that helps, not the albuterol . Depomedrol 120 mg IM  Cough/ congestion >  400  mg up every 4 hours  - 1200 mg every 12 hours  GERD diet reviewed, bed blocks rec  Make sure you check your oxygen  saturation  AT  your highest level of activity (not after you stop)   to be sure it stays over 90%  Please schedule a follow up Cole [name] visit in 4 weeks    05/11/2023  f/u ov/Cole Cole [name]/Cole Cole [name] re: GOLD group B maint on stioto did  bring meds  Chief Complaint  Patient presents  with   COPD   Dyspnea:  weed eating in early am/ too hot to go MB Cough: very little     04/22/2024  f/u ov/Cole Cole [name]/Cole Cole [name] re: 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   Chief Complaint  Patient presents with   COPD    Pft 12/19/23 DOE   Dyspnea:  6 months feels sob x one hour at 5 pm hasn't  thought about using rescue rx than as takes it up to q 4h anyway and doesn't check sats either then or with exertion as rec  Cough: not much  Sleeping: on floor with 2 pillows x years s    resp cc  SABA use: up q 4 hourss (due to  heat )hfa/ neb twice daily  02:  prn daytime  but not really using it Rec Work on inhaler technique:    Also  Ok to try albuterol  15 min before an activity (on alternating days between the inhaler/ then nebulizer then nothing)  that you know would usually make you short of breath   Make sure you check your oxygen  saturation  AT  your highest level of activity (not  after you stop)   to be sure it stays over 90%   For leg swelling furosemide  20 mg one daily as needed and follow up with Dr Cole [name]   Please schedule a follow up Cole [name] visit in 6 weeks, call sooner if needed    06/03/2024  f/u ov/Cole Cole [name]/Cole Cole [name] re: COPD 3 GOLD group B  maint on stiolto    Cc doe no better, esp in heat/ humidity  Dyspnea:  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   Dyspnea:  *** Cough: *** Sleeping: ***   resp cc  SABA use: *** 02: ***  Lung cancer  screening: ***   No obvious day to day or daytime variability or assoc excess/ purulent sputum or mucus plugs or hemoptysis or cp or chest tightness, subjective wheeze or overt sinus or hb symptoms.    Also denies any obvious fluctuation of symptoms with weather or environmental changes or other aggravating or alleviating factors except as outlined above   No unusual exposure hx or h/o childhood pna/ asthma or knowledge of premature birth.  Current Allergies, Complete Past Medical History, Past Surgical History, Family History, and Social History were reviewed in [name] [employer] record.  ROS  The following are not active complaints unless bolded Hoarseness, sore throat, dysphagia, dental problems, itching, sneezing,  nasal congestion or discharge of excess mucus or purulent secretions, ear ache,   fever, chills, sweats, unintended wt loss or wt gain, classically pleuritic or exertional cp,  orthopnea pnd or arm/hand swelling  or leg swelling, presyncope, palpitations, abdominal pain, anorexia, nausea, vomiting, diarrhea  or change in bowel habits or change in bladder habits, change in stools or change in urine, dysuria, hematuria,  rash, arthralgias, visual complaints, headache, numbness, weakness or ataxia or problems with walking or coordination,  change in mood or  memory.         Outpatient Medications Prior to Visit  Medication Sig Dispense Refill   albuterol  (PROVENTIL ) (2.5 MG/3ML) 0.083% nebulizer solution USE 1 VIAL ( ) BY NEBULIZER EVERY 4 HOURS AS NEEDED FOR WHEEZING OR SHORTNESS OF BREATH 75 mL 12   albuterol  (VENTOLIN  HFA) 108 (90 Base) MCG/ACT inhaler Inhale 2 puffs into the lungs every 6 (six) hours as needed for wheezing or shortness of breath. 18 g 3   bisoprolol  (ZEBETA ) 5 MG tablet TAKE 1/2 TABLET BY MOUTH DAILY AS NEEDED FOR PALPITATIONS 30 tablet 3   DILT-XR 240 MG 24 hr capsule Take 240 mg by mouth daily.     ELIQUIS 5 MG TABS tablet Take 5 mg by mouth  2 (two) times daily.     furosemide  (LASIX ) 40 MG tablet Take 1 tablet (40 mg total) by mouth daily. 90 tablet 3   guaiFENesin  (MUCINEX ) 600 MG 12 hr tablet Take by mouth 2 (two) times daily. (Patient taking  differently: Take 600 mg by mouth as needed for cough.)     losartan  (COZAAR ) 25 MG tablet TAKE ONE TABLET BY MOUTH ONCE DAILY 90 tablet 3   potassium chloride  (KLOR-CON ) 10 MEQ tablet Take 1 tablet (10 mEq total) by mouth daily. 30 tablet 1   STIOLTO RESPIMAT  2.5-2.5 MCG/ACT AERS INHALE TWO PUFFS ONCE DAILY 4 g 11   tamsulosin  (FLOMAX ) 0.4 MG CAPS capsule Take 0.4 mg by mouth daily.      No facility-administered medications prior to visit.             Past Medical History:  Diagnosis Date   Atrial fibrillation (HCC)    BPH (benign prostatic hyperplasia)    COPD (chronic obstructive pulmonary disease) (HCC)    Hypertension        Objective:    Wts  10/02/2024        ***  06/03/2024       214   04/22/2024       214  10/17/2023         208  05/11/2023       208   03/09/2023       207   12/05/22 204 lb 6.4 oz (92.7 kg)  11/03/22 209 lb (94.8 kg)  10/28/22 209 lb 3.2 oz (94.9 kg)    Vital signs reviewed  10/02/2024  - Note at rest 02 sats  ***% on ***   General appearance:    ***            Assessment                               "

## 2024-10-23 ENCOUNTER — Ambulatory Visit: Admitting: Cardiology

## 2024-10-31 ENCOUNTER — Ambulatory Visit: Admitting: Internal Medicine

## 2024-11-01 ENCOUNTER — Ambulatory Visit: Admitting: Cardiology
